# Patient Record
Sex: Female | Born: 2000 | Race: Black or African American | Hispanic: No | Marital: Single | Smoking: Never smoker
Health system: Southern US, Community
[De-identification: ages and names within clinical notes are randomized; demographics above are authoritative.]

---
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
## 2018-01-20 ENCOUNTER — Ambulatory Visit: Payer: BC Managed Care – PPO | Admitting: Family Medicine

## 2018-01-30 ENCOUNTER — Ambulatory Visit (INDEPENDENT_AMBULATORY_CARE_PROVIDER_SITE_OTHER): Payer: BC Managed Care – PPO | Admitting: Nurse Practitioner

## 2018-01-30 ENCOUNTER — Encounter: Payer: Self-pay | Admitting: Obstetrics

## 2018-01-30 ENCOUNTER — Other Ambulatory Visit: Payer: Self-pay

## 2018-01-30 ENCOUNTER — Encounter: Payer: Self-pay | Admitting: Nurse Practitioner

## 2018-01-30 VITALS — BP 120/79 | HR 77 | Temp 97.7°F | Ht 65.0 in | Wt 221.9 lb

## 2018-01-30 DIAGNOSIS — Z0000 Encounter for general adult medical examination without abnormal findings: Secondary | ICD-10-CM | POA: Diagnosis not present

## 2018-01-30 DIAGNOSIS — Z6836 Body mass index (BMI) 36.0-36.9, adult: Secondary | ICD-10-CM

## 2018-01-30 DIAGNOSIS — Z30011 Encounter for initial prescription of contraceptive pills: Secondary | ICD-10-CM | POA: Diagnosis not present

## 2018-01-30 DIAGNOSIS — E049 Nontoxic goiter, unspecified: Secondary | ICD-10-CM

## 2018-01-30 MED ORDER — LEVONORGESTREL-ETHINYL ESTRAD 0.15-30 MG-MCG PO TABS
1.0000 | ORAL_TABLET | Freq: Every day | ORAL | 2 refills | Status: DC
Start: 2018-01-30 — End: 2018-04-24

## 2018-01-30 NOTE — Progress Notes (Signed)
GYNECOLOGY ANNUAL PREVENTATIVE CARE ENCOUNTER NOTE  Subjective:   Emma Mendoza is a 17 y.o. G0P0000 female here for a routine annual gynecologic exam.  Current complaints: wants to start birth control.   Denies abnormal vaginal bleeding, discharge, pelvic pain, problems with intercourse or other gynecologic concerns.   Has had intercourse once last year and used a condom.  Currently has 3 day menstrual cycles and takes Motrin for cramping.  Discussed reproductive life plan and currently does not want to become pregnant.  Was thinking of a Nexplanon for birth control but has a distinct preference for wanting to have a period monthly and is happy with her current periods.  Is in college and has noticed that it is easier to gain weight in college.  Has been seen at [facility] and was told that her thyroid is enlarged and she needs to have blood work.  Denies night sweats or episodes of chilling.  Denies any problems with headaches.  No personal or family history of blood clots in lungs or legs.  Gynecologic History Patient's last menstrual period was 01/21/2018 (exact date). Contraception: condoms  Obstetric History OB History  Gravida Para Term Preterm AB Living  0 0 0 0 0 0  SAB TAB Ectopic Multiple Live Births  0 0 0 0 0    History reviewed. No pertinent past medical history.  History reviewed. No pertinent surgical history.  No current outpatient medications on file prior to visit.   No current facility-administered medications on file prior to visit.     No Known Allergies  Social History   Socioeconomic History  . Marital status: Single    Spouse name: Not on file  . Number of children: Not on file  . Years of education: Not on file  . Highest education level: Not on file  Occupational History    Employer: [employer]  Social Needs  . Financial resource strain: Not on file  . Food insecurity:    Worry: Not on file    Inability: Not on file  . Transportation  needs:    Medical: Not on file    Non-medical: Not on file  Tobacco Use  . Smoking status: Never Smoker  . Smokeless tobacco: Never Used  Substance and Sexual Activity  . Alcohol use: No  . Drug use: No  . Sexual activity: Not Currently  Lifestyle  . Physical activity:    Days per week: Not on file    Minutes per session: Not on file  . Stress: Not on file  Relationships  . Social connections:    Talks on phone: Not on file    Gets together: Not on file    Attends religious service: Not on file    Active member of club or organization: Not on file    Attends meetings of clubs or organizations: Not on file    Relationship status: Not on file  . Intimate partner violence:    Fear of current or ex partner: Not on file    Emotionally abused: Not on file    Physically abused: Not on file    Forced sexual activity: Not on file  Other Topics Concern  . Not on file  Social History Narrative   She is planning on going to [employer] in the Fall. She is majoring in Building surveyor. Plans to live in the dorm. Her grades were great in high school, A's and B's. Has a good friend group. Currently works  at [employer].       Exercise: She does not do any structured exercise. Walks at work.      Diet: Eats fast food daily. Like wendys, chick fila, and churches chicken. Will eat some fruit like pineapple and strawberries. Drinks sweet teas and water. Does not take a daily multivitamin.     Family History  Problem Relation Age of Onset  . Cancer Paternal Aunt   . Diabetes Maternal Grandfather   . Diabetes Paternal Grandfather   . Cancer Paternal Grandfather   . Cancer Mother 42       breast cancer  . Diabetes Father     The following portions of the patient's history were reviewed and updated as appropriate: allergies, current medications, past family history, past medical history, past social history, past surgical history and problem list.  Review of Systems Pertinent items  noted in HPI and remainder of comprehensive ROS otherwise negative.   Objective:  BP 120/79   Pulse 77   Temp 97.7 F (36.5 C)   Ht 5\' 5"  (1.651 m)   Wt 221 lb 14.4 oz (100.7 kg)   LMP 01/21/2018 (Exact Date)   BMI 36.93 kg/m  CONSTITUTIONAL: Well-developed, well-nourished female in no acute distress.  HENT:  Normocephalic, atraumatic, External right and left ear normal.  EYES: Conjunctivae and EOM are normal. Pupils are equal, round.  No scleral icterus.  NECK: Normal range of motion, supple, no masses.  Enlarged thyroid bilaterally - soft no masses.  SKIN: Skin is warm and dry. No rash noted. Not diaphoretic. No erythema. No pallor. NEUROLOGIC: Alert and oriented to person, place, and time. Normal reflexes, muscle tone coordination. No cranial nerve deficit noted. PSYCHIATRIC: Normal mood and affect. Normal behavior. Normal judgment and thought content. CARDIOVASCULAR: Normal heart rate noted, regular rhythm RESPIRATORY: Clear to auscultation bilaterally. Effort and breath sounds normal, no problems with respiration noted. BREASTS: Deferred PELVIC: Deferred MUSCULOSKELETAL: Normal range of motion. No tenderness.  No cyanosis, clubbing, or edema.    Assessment and Plan:  1. Enlarged thyroid  - TSH - T4, free - T3  2. Encounter for annual physical examination excluding gynecological examination in a patient older than 17 years  - Cervicovaginal ancillary only - unable to urinate so will cancel this testing as she is not having any symptoms - RPR - HIV antibody (with reflex)  3.  BMI of 36 Discussed healthy eating and increasing her exercise.  4.  Birth control prescription done Advised to begin on Sunday once menses starts.  Advised condoms for intercourse always for STD protection and as another measure to ensure she does not become pregnant.  Pap not indicated until age 83 Routine preventative health maintenance measures emphasized. Please refer to After Visit Summary  for other counseling recommendations.    Nolene Bernheim, RN, MSN, NP-BC Nurse Practitioner, [facility]

## 2018-01-30 NOTE — Progress Notes (Signed)
New GYN presents for abnormal periods lasting three days with a light day in between. She wants her Thyroids checked.

## 2018-01-30 NOTE — Patient Instructions (Addendum)
Look at the website:  Bedsider.org for info on contraception  Eat healthy food and increase exercise to take weight to a BMI less than 25.  Drink at least 8 8-oz glasses of water every day.  Start your pills on Sunday when your period starts.  It is OK to take the pills if you happen to still be bleeding on that Sunday.   Oral Contraception Use Oral contraceptive pills (OCPs) are medicines taken to prevent pregnancy. OCPs work by preventing the ovaries from releasing eggs. The hormones in OCPs also cause the cervical mucus to thicken, preventing the sperm from entering the uterus. The hormones also cause the uterine lining to become thin, not allowing a fertilized egg to attach to the inside of the uterus. OCPs are highly effective when taken exactly as prescribed. However, OCPs do not prevent sexually transmitted diseases (STDs). Safe sex practices, such as using condoms along with an OCP, can help prevent STDs. Before taking OCPs, you may have a physical exam and Pap test. Your health care provider may also order blood tests if necessary. Your health care provider will make sure you are a good candidate for oral contraception. Discuss with your health care provider the possible side effects of the OCP you may be prescribed. When starting an OCP, it can take 2 to 3 months for the body to adjust to the changes in hormone levels in your body. How to take oral contraceptive pills Your health care provider may advise you on how to start taking the first cycle of OCPs. Otherwise, you can:  Start on day 1 of your menstrual period. You will not need any backup contraceptive protection with this start time.  Start on the first Sunday after your menstrual period or the day you get your prescription. In these cases, you will need to use backup contraceptive protection for the first week.  Start the pill at any time of your cycle. If you take the pill within 5 days of the start of your period, you are  protected against pregnancy right away. In this case, you will not need a backup form of birth control. If you start at any other time of your menstrual cycle, you will need to use another form of birth control for 7 days. If your OCP is the type called a minipill, it will protect you from pregnancy after taking it for 2 days (48 hours).  After you have started taking OCPs:  If you forget to take 1 pill, take it as soon as you remember. Take the next pill at the regular time.  If you miss 2 or more pills, call your health care provider because different pills have different instructions for missed doses. Use backup birth control until your next menstrual period starts.  If you use a 28-day pack that contains inactive pills and you miss 1 of the last 7 pills (pills with no hormones), it will not matter. Throw away the rest of the non-hormone pills and start a new pill pack.  No matter which day you start the OCP, you will always start a new pack on that same day of the week. Have an extra pack of OCPs and a backup contraceptive method available in case you miss some pills or lose your OCP pack. Follow these instructions at home:  Do not smoke.  Always use a condom to protect against STDs. OCPs do not protect against STDs.  Use a calendar to mark your menstrual period days.  Read  the information and directions that came with your OCP. Talk to your health care provider if you have questions. Contact a health care provider if:  You develop nausea and vomiting.  You have abnormal vaginal discharge or bleeding.  You develop a rash.  You miss your menstrual period.  You are losing your hair.  You need treatment for mood swings or depression.  You get dizzy when taking the OCP.  You develop acne from taking the OCP.  You become pregnant. Get help right away if:  You develop chest pain.  You develop shortness of breath.  You have an uncontrolled or severe headache.  You develop  numbness or slurred speech.  You develop visual problems.  You develop pain, redness, and swelling in the legs. This information is not intended to replace advice given to you by your health care provider. Make sure you discuss any questions you have with your health care provider. Document Released: 03/21/2011 Document Revised: 09/07/2015 Document Reviewed: 09/20/2012 Elsevier Interactive Patient Education  2017 [employer].  Safe Sex Practicing safe sex means taking steps before and during sex to reduce your risk of:  Getting an STD (sexually transmitted disease).  Giving your partner an STD.  Unwanted pregnancy.  How can I practice safe sex?  To practice safe sex:  Limit your sexual partners to only one partner who is having sex with only you.  Avoid using alcohol and recreational drugs before having sex. These substances can affect your judgment.  Before having sex with a new partner: ? Talk to your partner about past partners, past STDs, and drug use. ? You and your partner should be screened for STDs and discuss the results with each other.  Check your body regularly for sores, blisters, rashes, or unusual discharge. If you notice any of these problems, visit your health care provider.  If you have symptoms of an infection or you are being treated for an STD, avoid sexual contact.  While having sex, use a condom. Make sure to: ? Use a condom every time you have vaginal, oral, or anal sex. Both females and males should wear condoms during oral sex. ? Keep condoms in place from the beginning to the end of sexual activity. ? Use a latex condom, if possible. Latex condoms offer the best protection. ? Use only water-based lubricants or oils to lubricate a condom. Using petroleum-based lubricants or oils will weaken the condom and increase the chance that it will break.  See your health care provider for regular screenings, exams, and tests for STDs.  Talk with your health  care provider about the form of birth control (contraception) that is best for you.  Get vaccinated against hepatitis B and human papillomavirus (HPV).  If you are at risk of being infected with HIV (human immunodeficiency virus), talk with your health care provider about taking a prescription medicine to prevent HIV infection. You are considered at risk for HIV if: ? You are a man who has sex with other men. ? You are a heterosexual man or woman who is sexually active with more than one partner. ? You take drugs by injection. ? You are sexually active with a partner who has HIV.  This information is not intended to replace advice given to you by your health care provider. Make sure you discuss any questions you have with your health care provider. Document Released: 05/09/2004 Document Revised: 08/16/2015 Document Reviewed: 02/19/2015 Elsevier Interactive Patient Education  [employer].

## 2018-01-31 LAB — T4, FREE: Free T4: 1.03 ng/dL (ref 0.93–1.60)

## 2018-01-31 LAB — T3: T3, Total: 122 ng/dL (ref 71–180)

## 2018-01-31 LAB — HIV ANTIBODY (ROUTINE TESTING W REFLEX): HIV Screen 4th Generation wRfx: NONREACTIVE

## 2018-01-31 LAB — RPR: RPR: NONREACTIVE

## 2018-01-31 LAB — TSH: TSH: 0.631 u[IU]/mL (ref 0.450–4.500)

## 2018-04-24 ENCOUNTER — Other Ambulatory Visit: Payer: Self-pay

## 2018-04-24 DIAGNOSIS — Z3041 Encounter for surveillance of contraceptive pills: Secondary | ICD-10-CM

## 2018-04-24 MED ORDER — LEVONORGESTREL-ETHINYL ESTRAD 0.15-30 MG-MCG PO TABS: 1.0000 | ORAL_TABLET | Freq: Every day | ORAL | 2 refills | Status: DC

## 2018-07-09 ENCOUNTER — Other Ambulatory Visit: Payer: Self-pay

## 2018-07-09 DIAGNOSIS — Z3041 Encounter for surveillance of contraceptive pills: Secondary | ICD-10-CM

## 2018-07-09 MED ORDER — LEVONORGESTREL-ETHINYL ESTRAD 0.15-30 MG-MCG PO TABS: 1.0000 | ORAL_TABLET | Freq: Every day | ORAL | 3 refills | Status: DC

## 2018-07-09 NOTE — Progress Notes (Signed)
Refill request for OCP done x 3

## 2018-10-05 ENCOUNTER — Other Ambulatory Visit: Payer: Self-pay | Admitting: Nurse Practitioner

## 2018-10-05 DIAGNOSIS — Z3041 Encounter for surveillance of contraceptive pills: Secondary | ICD-10-CM

## 2018-12-02 ENCOUNTER — Other Ambulatory Visit: Payer: Self-pay

## 2018-12-02 ENCOUNTER — Encounter (HOSPITAL_COMMUNITY): Payer: Self-pay

## 2018-12-02 ENCOUNTER — Ambulatory Visit (HOSPITAL_COMMUNITY)
Admission: EM | Admit: 2018-12-02 | Discharge: 2018-12-02 | Disposition: A | Discharge status: Home / Self Care | Payer: BC Managed Care – PPO | Attending: Family Medicine | Admitting: Family Medicine

## 2018-12-02 DIAGNOSIS — Z3041 Encounter for surveillance of contraceptive pills: Secondary | ICD-10-CM

## 2018-12-02 DIAGNOSIS — N9089 Other specified noninflammatory disorders of vulva and perineum: Secondary | ICD-10-CM | POA: Insufficient documentation

## 2018-12-02 MED ORDER — LEVONORGESTREL-ETHINYL ESTRAD 0.15-30 MG-MCG PO TABS: 1.0000 | ORAL_TABLET | Freq: Every day | ORAL | 3 refills | Status: DC

## 2018-12-02 NOTE — Progress Notes (Signed)
Birth control refill x3 months to get patient through until annual exam.

## 2018-12-02 NOTE — ED Provider Notes (Signed)
[facility]    CSN: [id number] Arrival date & time: 12/02/18  1704      History   Chief Complaint Chief Complaint  Patient presents with  . Vaginal Irritation    HPI Emma Mendoza is a 18 y.o. female.   Emma Mendoza presents with complaints of irritation to vulva. There is a specific area of skin breakdown which is painful. Started 8/14, she used organic pads for the first time. Has been painful since. Painful with wiping for example or if urine hits the area. She went to another urgent care two days ago and was told it is is skin irritation and to use petroleum jelly but this has not helped at all. She is not able to get in with her PCP for two weeks. She has has some similar in the past related to shaving but typically do not last this long. No other vaginal discharge. No urinary symptoms. Sexually active with 1 partner and uses condoms. Without contributing medical history.      ROS per HPI, negative if not otherwise mentioned.      History reviewed. No pertinent past medical history.  Patient Active Problem List   Diagnosis Date Noted  . BMI 36.0-36.9,adult 01/30/2018  . Encounter for BCP initial prescription 01/30/2018  . Enlarged thyroid 01/30/2018    History reviewed. No pertinent surgical history.  OB History    Gravida  0   Para  0   Term  0   Preterm  0   AB  0   Living  0     SAB  0   TAB  0   Ectopic  0   Multiple  0   Live Births  0            Home Medications    Prior to Admission medications   Medication Sig Start Date End Date Taking? Authorizing Provider  levonorgestrel-ethinyl estradiol (NORDETTE) 0.15-30 MG-MCG tablet Take 1 tablet by mouth daily. 12/02/18   Shelly Bombard, MD    Family History Family History  Problem Relation Age of Onset  . Cancer Paternal Aunt   . Diabetes Maternal Grandfather   . Diabetes Paternal Grandfather   . Cancer Paternal Grandfather   . Cancer Mother 72       breast cancer  .  Diabetes Father     Social History Social History   Tobacco Use  . Smoking status: Never Smoker  . Smokeless tobacco: Never Used  Substance Use Topics  . Alcohol use: No  . Drug use: No     Allergies   Patient has no known allergies.   Review of Systems Review of Systems   Physical Exam Triage Vital Signs ED Triage Vitals  Enc Vitals Group     BP 12/02/18 1717 131/82     Pulse Rate 12/02/18 1717 83     Resp 12/02/18 1717 20     Temp 12/02/18 1717 98.6 F (37 C)     Temp Source 12/02/18 1717 Oral     SpO2 12/02/18 1717 100 %     Weight --      Height --      Head Circumference --      Peak Flow --      Pain Score 12/02/18 1719 4     Pain Loc --      Pain Edu? --      Excl. in [place]? --    No data found.  Updated Vital  Signs BP 131/82 (BP Location: Right Arm)   Pulse 83   Temp 98.6 F (37 C) (Oral)   Resp 20   LMP 11/27/2018   SpO2 100%   Visual Acuity Right Eye Distance:   Left Eye Distance:   Bilateral Distance:    Right Eye Near:   Left Eye Near:    Bilateral Near:     Physical Exam Constitutional:      General: She is not in acute distress.    Appearance: She is well-developed.  Cardiovascular:     Rate and Rhythm: Normal rate.  Pulmonary:     Effort: Pulmonary effort is normal.  Genitourinary:      Comments: Open skin posterior to labia and vagina; tender; no obvious ulceration or vesicle; no drainage; no pustule or papule; appears to be in area of friction and patient does shave pubic hair ; some external thin white vaginal discharge noted; no other rash Skin:    General: Skin is warm and dry.  Neurological:     Mental Status: She is alert and oriented to person, place, and time.      UC Treatments / Results  Labs (all labs ordered are listed, but only abnormal results are displayed) Labs Reviewed  HSV CULTURE AND TYPING  CERVICOVAGINAL ANCILLARY ONLY    EKG   Radiology No results found.  Procedures Procedures  (including critical care time)  Medications Ordered in UC Medications - No data to display  Initial Impression / Assessment and Plan / UC Course  I have reviewed the triage vital signs and the nursing notes.  Pertinent labs & imaging results that were available during my care of the patient were reviewed by me and considered in my medical decision making (see chart for details).     Affected area does appear related to friction, likely originating while she was on her period using pads, and now with skin friction perpetuating the irritation. Provided herpes culture for reassurance, vaginal cytology also collected and pending. Will notify of any positive findings and if any changes to treatment are needed. Supportive cares recommended. If symptoms worsen or do not improve in the next week to return to be seen or to follow up with PCP.  Patient verbalized understanding and agreeable to plan.   Final Clinical Impressions(s) / UC Diagnoses   Final diagnoses:  Vulvar irritation     Discharge Instructions     This does appear likely related to friction.  You may continue to use an emollient such as vaseline to help to protect it and to decrease the friction to the area until it is able to heal.  Wear lose fitting clothing and change out of anything wet, promptly.  Will notify you of any positive findings from your samples and if any changes to treatment are needed. You may monitor your results on your MyChart online as well.     ED Prescriptions    None     Controlled Substance Prescriptions [place] Controlled Substance Registry consulted? Not Applicable   Georgetta HaberBurky, Shawnee Gambone B, NP 12/02/18 2128

## 2018-12-02 NOTE — ED Triage Notes (Signed)
Pt presents with a vaginal skin irritation X 7 days with no relief.

## 2018-12-02 NOTE — Discharge Instructions (Signed)
This does appear likely related to friction.  You may continue to use an emollient such as vaseline to help to protect it and to decrease the friction to the area until it is able to heal.  Wear lose fitting clothing and change out of anything wet, promptly.  Will notify you of any positive findings from your samples and if any changes to treatment are needed. You may monitor your results on your MyChart online as well.

## 2018-12-04 LAB — HSV CULTURE AND TYPING

## 2018-12-04 LAB — CERVICOVAGINAL ANCILLARY ONLY
Bacterial vaginitis: POSITIVE — AB
Candida vaginitis: NEGATIVE
Chlamydia: POSITIVE — AB
Neisseria Gonorrhea: NEGATIVE
Trichomonas: NEGATIVE

## 2018-12-05 ENCOUNTER — Encounter (HOSPITAL_COMMUNITY): Payer: Self-pay

## 2018-12-05 ENCOUNTER — Ambulatory Visit (HOSPITAL_COMMUNITY)
Admission: EM | Admit: 2018-12-05 | Discharge: 2018-12-05 | Disposition: A | Discharge status: Home / Self Care | Payer: BC Managed Care – PPO | Attending: Family Medicine | Admitting: Family Medicine

## 2018-12-05 ENCOUNTER — Other Ambulatory Visit: Payer: Self-pay

## 2018-12-05 DIAGNOSIS — A749 Chlamydial infection, unspecified: Secondary | ICD-10-CM

## 2018-12-05 DIAGNOSIS — B9689 Other specified bacterial agents as the cause of diseases classified elsewhere: Secondary | ICD-10-CM

## 2018-12-05 DIAGNOSIS — R768 Other specified abnormal immunological findings in serum: Secondary | ICD-10-CM

## 2018-12-05 DIAGNOSIS — N760 Acute vaginitis: Secondary | ICD-10-CM

## 2018-12-05 MED ORDER — TRIAMCINOLONE ACETONIDE 0.025 % EX OINT: 1.0000 "application " | TOPICAL_OINTMENT | Freq: Two times a day (BID) | CUTANEOUS | 0 refills | Status: DC | PRN

## 2018-12-05 MED ORDER — AZITHROMYCIN 250 MG PO TABS
ORAL_TABLET | ORAL | Status: AC
  Filled 2018-12-05: qty 4

## 2018-12-05 MED ORDER — VALACYCLOVIR HCL 1 G PO TABS: 1000.0000 mg | ORAL_TABLET | Freq: Two times a day (BID) | ORAL | 1 refills | Status: AC

## 2018-12-05 MED ORDER — METRONIDAZOLE 500 MG PO TABS: 500.0000 mg | ORAL_TABLET | Freq: Two times a day (BID) | ORAL | 0 refills | Status: DC

## 2018-12-05 MED ORDER — AZITHROMYCIN 250 MG PO TABS
1000.0000 mg | ORAL_TABLET | Freq: Once | ORAL | Status: AC
  Administered 2018-12-05: 1000 mg via ORAL

## 2018-12-05 NOTE — Discharge Instructions (Addendum)
Follow-up with your gynecologist if symptoms do not improve with prescribed treatment. You can try OTC probiotics to reduce reoccurrence of bacterial vaginosis.

## 2018-12-05 NOTE — ED Triage Notes (Signed)
Pt came in to discuss lab results and treatment plan.

## 2018-12-05 NOTE — ED Provider Notes (Addendum)
[facility]    CSN: [id number] Arrival date & time: 12/05/18  1302      History   Chief Complaint Chief Complaint  Patient presents with  . Discuss lab result and treatment    HPI Emma Mendoza is a 18 y.o. female.   HPI  Patient recently seen here at [facility] urgent [facility] with a complaint of vaginal ulceration.  Provider did perform STD testing which resulted positive for chlamydia and HSV 2 per culture.  Patient presents today for treatment and to discuss recent findings of labs.   History reviewed. No pertinent past medical history.  Patient Active Problem List   Diagnosis Date Noted  . BMI 36.0-36.9,adult 01/30/2018  . Encounter for BCP initial prescription 01/30/2018  . Enlarged thyroid 01/30/2018    History reviewed. No pertinent surgical history.  OB History    Gravida  0   Para  0   Term  0   Preterm  0   AB  0   Living  0     SAB  0   TAB  0   Ectopic  0   Multiple  0   Live Births  0            Home Medications    Prior to Admission medications   Medication Sig Start Date End Date Taking? Authorizing Provider  levonorgestrel-ethinyl estradiol (NORDETTE) 0.15-30 MG-MCG tablet Take 1 tablet by mouth daily. 12/02/18   Brock BadHarper, Charles A, MD    Family History Family History  Problem Relation Age of Onset  . Cancer Paternal Aunt   . Diabetes Maternal Grandfather   . Diabetes Paternal Grandfather   . Cancer Paternal Grandfather   . Cancer Mother [id number]       breast cancer  . Diabetes Father     Social History Social History   Tobacco Use  . Smoking status: Never Smoker  . Smokeless tobacco: Never Used  Substance Use Topics  . Alcohol use: No  . Drug use: No     Allergies   Patient has no known allergies.   Review of Systems Review of Systems Pertinent negatives listed in HPI Physical Exam Triage Vital Signs ED Triage Vitals  Enc Vitals Group     BP 12/05/18 1311 125/83     Pulse Rate 12/05/18 1311 90    Resp 12/05/18 1311 17     Temp 12/05/18 1311 99.2 F (37.3 C)     Temp Source 12/05/18 1311 Oral     SpO2 12/05/18 1311 100 %     Weight --      Height --      Head Circumference --      Peak Flow --      Pain Score 12/05/18 1319 3     Pain Loc --      Pain Edu? --      Excl. in [place]? --    No data found.  Updated Vital Signs BP 125/83 (BP Location: Right Arm)   Pulse 90   Temp 99.2 F (37.3 C) (Oral)   Resp 17   LMP 11/27/2018   SpO2 100%   Visual Acuity Right Eye Distance:   Left Eye Distance:   Bilateral Distance:    Right Eye Near:   Left Eye Near:    Bilateral Near:     Physical Exam General appearance: alert, well developed, well nourished, cooperative and in no distress Head: Normocephalic, without obvious abnormality, atraumatic Respiratory: Respirations even  and unlabored, normal respiratory rate Heart: rate and rhythm normal. No gallop or murmurs noted on exam  Abdomen: BS +, no distention, no rebound tenderness, or no mass Extremities: No gross deformities Skin: Skin color, texture, turgor normal. No rashes seen  Psych: Appropriate mood and affect. Neurologic: Mental status: Alert, oriented to person, place, and time, thought content appropriate.  UC Treatments / Results  Labs (all labs ordered are listed, but only abnormal results are displayed) Labs Reviewed - No data to display  EKG   Radiology No results found.  Procedures Procedures (including critical care time)  Medications Ordered in UC Medications - No data to display  Initial Impression / Assessment and Plan / UC Course  I have reviewed the triage vital signs and the nursing notes.  Pertinent labs & imaging results that were available during my care of the patient were reviewed by me and considered in my medical decision making (see chart for details).    Recent STD test results discussed at length. Treated for chlamydia empirically here in office today.  HSV 2 treatment  prescribed with valacyclovir 1000 mg twice daily x10 days.  Bacterial vaginosis treatment also prescribed for metronidazole 500 mg twice daily x7 days.  Questions guided and answered.  Patient was given instructions on safe sex practices and routine STD testing.  Patient verbalized understanding and agreement with plan. Final Clinical Impressions(s) / UC Diagnoses   Final diagnoses:  Chlamydia  HSV-2 seropositive  Bacterial vaginosis     Discharge Instructions     Follow-up with your gynecologist if symptoms do not improve with prescribed treatment. You can try OTC probiotics to reduce reoccurrence of bacterial vaginosis.    ED Prescriptions    Medication Sig Dispense Auth. Provider   triamcinolone (KENALOG) 0.025 % ointment Apply 1 application topically 2 (two) times daily as needed. 60 g Scot Jun, FNP   metroNIDAZOLE (FLAGYL) 500 MG tablet Take 1 tablet (500 mg total) by mouth 2 (two) times daily with a meal. DO NOT CONSUME ALCOHOL WHILE TAKING THIS MEDICATION. 14 tablet Scot Jun, FNP   valACYclovir (VALTREX) 1000 MG tablet Take 1 tablet (1,000 mg total) by mouth 2 (two) times daily for 10 days. 20 tablet Scot Jun, FNP     Controlled Substance Prescriptions [place] Controlled Substance Registry consulted? Not Applicable   Scot Jun, FNP 12/05/18 1518    Scot Jun, FNP 12/05/18 [phone]

## 2018-12-15 ENCOUNTER — Other Ambulatory Visit: Payer: Self-pay

## 2018-12-15 ENCOUNTER — Ambulatory Visit (INDEPENDENT_AMBULATORY_CARE_PROVIDER_SITE_OTHER): Payer: BC Managed Care – PPO | Admitting: Advanced Practice Midwife

## 2018-12-15 VITALS — BP 108/78 | HR 85 | Wt 222.0 lb

## 2018-12-15 DIAGNOSIS — A749 Chlamydial infection, unspecified: Secondary | ICD-10-CM | POA: Diagnosis not present

## 2018-12-15 DIAGNOSIS — Z3009 Encounter for other general counseling and advice on contraception: Secondary | ICD-10-CM

## 2018-12-15 DIAGNOSIS — A6004 Herpesviral vulvovaginitis: Secondary | ICD-10-CM | POA: Insufficient documentation

## 2018-12-15 MED ORDER — VALACYCLOVIR HCL 1 G PO TABS: 1000.0000 mg | ORAL_TABLET | Freq: Every day | ORAL | 10 refills | Status: DC

## 2018-12-15 NOTE — Progress Notes (Signed)
  GYNECOLOGY PROGRESS NOTE  History:  18 y.o. G0P0000 presents to [facility] office today for problem gyn visit. She reports recent diagnosis of genital HSV, chlamydia and BV and completed treatment yesterday.  She reports vaginal irritation started after using pads for her menses, then developed into more painful lesions. She was diagnosed by vaginal swab by [facility].  She was also positive for chlamydia and treated with azithromycin in the office.  She completed 10 day course of Valtrex and 7 day course of Flagyl.  Her partner was tested, she is not sure he was treated, but she is no longer sexually active with him.  She reports the lesions have resolved and she has not pain or irritation.  She denies any abnormal discharge.  She denies h/a, dizziness, shortness of breath, n/v, or fever/chills.    The following portions of the patient's history were reviewed and updated as appropriate: allergies, current medications, past family history, past medical history, past social history, past surgical history and problem list. Last pap smear: n/a due to young age.  Review of Systems:  Pertinent items are noted in HPI.   Objective:  Physical Exam Blood pressure 108/78, pulse 85, weight 222 lb (100.7 kg), last menstrual period 11/27/2018. VS reviewed, nursing note reviewed,  Constitutional: well developed, well nourished, no distress HEENT: normocephalic CV: normal rate Pulm/chest wall: normal effort Breast Exam: deferred Abdomen: soft Neuro: alert and oriented x 3 Skin: warm, dry Psych: affect normal Pelvic exam: Deferred  Assessment & Plan:  1. Herpes simplex vulvovaginitis --Dx and treated with 10 day course of Valtrex --Symptoms improved per pt, offered exam of lesions to evaluate healing, pt declined --Rx for PRN use for recurrent outbreaks - valACYclovir (VALTREX) 1000 MG tablet; Take 1 tablet (1,000 mg total) by mouth daily.  Dispense: 5 tablet; Refill: 10  2. Chlamydia  infection --Treated on 12/05/18, no longer sexually active with partner  3. Encounter for counseling regarding contraception --Currently on OCPs.  Has missed pills this summer due to irregular scheduled. --Discussed LARCs as most effective forms of birth control.  Discussed benefits/risks of other methods.  Pt desires to continue OCPs for now but considering LARC. Market researcher given.   Fatima Blank, CNM 11:28 AM

## 2018-12-15 NOTE — Progress Notes (Signed)
RGYN patient presents for a problem visit today.  CC:  Pt states she had a vaginal irritation that was very painful/ pt had to go to 2 different urgent cares for an evaluation. [facility] pt was confirmed to have HSV-2 and CT.   Pt states she is feeling much better.patient still wants exam for assurance   Just finished Flagyl Saturday   Pt was treated for CT on 12/05/18 at the office where she was tested.

## 2019-01-31 ENCOUNTER — Other Ambulatory Visit: Payer: Self-pay

## 2019-01-31 ENCOUNTER — Encounter (HOSPITAL_COMMUNITY): Payer: Self-pay

## 2019-01-31 ENCOUNTER — Ambulatory Visit (HOSPITAL_COMMUNITY)
Admission: EM | Admit: 2019-01-31 | Discharge: 2019-01-31 | Disposition: A | Discharge status: Home / Self Care | Payer: BC Managed Care – PPO | Attending: Emergency Medicine | Admitting: Emergency Medicine

## 2019-01-31 DIAGNOSIS — R21 Rash and other nonspecific skin eruption: Secondary | ICD-10-CM | POA: Diagnosis not present

## 2019-01-31 MED ORDER — CLOTRIMAZOLE-BETAMETHASONE 1-0.05 % EX CREA
TOPICAL_CREAM | CUTANEOUS | 0 refills | Status: AC
Start: 2019-01-31 — End: ?

## 2019-01-31 NOTE — ED Provider Notes (Signed)
[facility]    CSN: [id number] Arrival date & time: 01/31/19  1338      History   Chief Complaint Chief Complaint  Patient presents with  . Appointment    140  . ringworm    HPI Emma Mendoza is a 18 y.o. female negativity past medical history presenting today for evaluation of a lesion to her face.  Patient states that over the past 3 days she has had an area that has been itchy to her left cheek.  Concerned about ringworm as she has had this similarly on her hands and neck in the past.  She denies other lesions elsewhere at this time.  Denies any fevers or chills.  Denies associated pain.  Denies any drainage.  HPI  History reviewed. No pertinent past medical history.  Patient Active Problem List   Diagnosis Date Noted  . Herpes simplex vulvovaginitis 12/15/2018  . Chlamydia infection 12/15/2018  . BMI 36.0-36.9,adult 01/30/2018  . Encounter for BCP initial prescription 01/30/2018  . Enlarged thyroid 01/30/2018    History reviewed. No pertinent surgical history.  OB History    Gravida  0   Para  0   Term  0   Preterm  0   AB  0   Living  0     SAB  0   TAB  0   Ectopic  0   Multiple  0   Live Births  0            Home Medications    Prior to Admission medications   Medication Sig Start Date End Date Taking? Authorizing Provider  clotrimazole-betamethasone (LOTRISONE) cream Apply to affected area 2 times daily 01/31/19   Wieters, [place] C, PA-C  levonorgestrel-ethinyl estradiol (NORDETTE) 0.15-30 MG-MCG tablet Take 1 tablet by mouth daily. 12/02/18   Brock Bad, MD  valACYclovir (VALTREX) 1000 MG tablet Take 1 tablet (1,000 mg total) by mouth daily. 12/15/18   Leftwich-Kirby, Wilmer Floor, CNM    Family History Family History  Problem Relation Age of Onset  . Cancer Paternal Aunt   . Diabetes Maternal Grandfather   . Diabetes Paternal Grandfather   . Cancer Paternal Grandfather   . Cancer Mother 56       breast cancer  .  Diabetes Father     Social History Social History   Tobacco Use  . Smoking status: Never Smoker  . Smokeless tobacco: Never Used  Substance Use Topics  . Alcohol use: No  . Drug use: No     Allergies   Patient has no known allergies.   Review of Systems Review of Systems  Constitutional: Negative for fatigue and fever.  Eyes: Negative for visual disturbance.  Respiratory: Negative for shortness of breath.   Cardiovascular: Negative for chest pain.  Gastrointestinal: Negative for abdominal pain, nausea and vomiting.  Musculoskeletal: Negative for arthralgias and joint swelling.  Skin: Positive for color change and rash. Negative for wound.  Neurological: Negative for dizziness, weakness, light-headedness and headaches.     Physical Exam Triage Vital Signs ED Triage Vitals  Enc Vitals Group     BP 01/31/19 1354 102/72     Pulse Rate 01/31/19 1354 83     Resp 01/31/19 1354 18     Temp 01/31/19 1354 98.7 F (37.1 C)     Temp Source 01/31/19 1354 Oral     SpO2 01/31/19 1354 97 %     Weight 01/31/19 1352 168 lb (76.2 kg)  Height --      Head Circumference --      Peak Flow --      Pain Score 01/31/19 1352 0     Pain Loc --      Pain Edu? --      Excl. in [place]? --    No data found.  Updated Vital Signs BP 102/72 (BP Location: Right Arm)   Pulse 83   Temp 98.7 F (37.1 C) (Oral)   Resp 18   Wt 168 lb (76.2 kg)   LMP 01/01/2019   SpO2 97%   BMI 27.96 kg/m   Visual Acuity Right Eye Distance:   Left Eye Distance:   Bilateral Distance:    Right Eye Near:   Left Eye Near:    Bilateral Near:     Physical Exam Vitals signs and nursing note reviewed.  Constitutional:      Appearance: She is well-developed.     Comments: No acute distress  HENT:     Head: Normocephalic and atraumatic.     Nose: Nose normal.  Eyes:     Conjunctiva/sclera: Conjunctivae normal.  Neck:     Musculoskeletal: Neck supple.  Cardiovascular:     Rate and Rhythm: Normal  rate.  Pulmonary:     Effort: Pulmonary effort is normal. No respiratory distress.  Abdominal:     General: There is no distension.  Musculoskeletal: Normal range of motion.  Skin:    General: Skin is warm and dry.     Comments: Isolated circular erythematous slightly raised lesion to left perioral area, small papular bumps within lesion, no surrounding induration or fluctuance  Neurological:     Mental Status: She is alert and oriented to person, place, and time.        UC Treatments / Results  Labs (all labs ordered are listed, but only abnormal results are displayed) Labs Reviewed - No data to display  EKG   Radiology No results found.  Procedures Procedures (including critical care time)  Medications Ordered in UC Medications - No data to display  Initial Impression / Assessment and Plan / UC Course  I have reviewed the triage vital signs and the nursing notes.  Pertinent labs & imaging results that were available during my care of the patient were reviewed by me and considered in my medical decision making (see chart for details).     Isolated lesion on face, eczematous versus fungal.  Seems less likely infectious as does not have any associated pain.  Providing Lotrisone to help with itching as well as to cover any fungal etiology.  Continue to monitor,Discussed strict return precautions. Patient verbalized understanding and is agreeable with plan.  Final Clinical Impressions(s) / UC Diagnoses   Final diagnoses:  Rash and nonspecific skin eruption     Discharge Instructions     Please apply lotrisone twice daily for the next week  Follow up if spreading, worsening, developing pain, swelling, drainage   ED Prescriptions    Medication Sig Dispense Auth. Provider   clotrimazole-betamethasone (LOTRISONE) cream Apply to affected area 2 times daily 30 g Wieters, [place] C, PA-C     PDMP not reviewed this encounter.   Joneen Caraway [place] C, PA-C 01/31/19  1430

## 2019-01-31 NOTE — Discharge Instructions (Addendum)
Please apply lotrisone twice daily for the next week  Follow up if spreading, worsening, developing pain, swelling, drainage

## 2019-01-31 NOTE — ED Triage Notes (Signed)
Pt states she has a ringworm on her face. X 3 days

## 2019-02-23 ENCOUNTER — Other Ambulatory Visit: Payer: Self-pay | Admitting: Obstetrics

## 2019-02-23 DIAGNOSIS — Z3041 Encounter for surveillance of contraceptive pills: Secondary | ICD-10-CM

## 2019-03-09 ENCOUNTER — Encounter (HOSPITAL_COMMUNITY): Payer: Self-pay

## 2019-03-09 ENCOUNTER — Other Ambulatory Visit: Payer: Self-pay

## 2019-03-09 ENCOUNTER — Ambulatory Visit (HOSPITAL_COMMUNITY)
Admission: EM | Admit: 2019-03-09 | Discharge: 2019-03-09 | Disposition: A | Discharge status: Home / Self Care | Payer: BC Managed Care – PPO | Attending: Nurse Practitioner | Admitting: Nurse Practitioner

## 2019-03-09 DIAGNOSIS — N760 Acute vaginitis: Secondary | ICD-10-CM | POA: Insufficient documentation

## 2019-03-09 DIAGNOSIS — N3000 Acute cystitis without hematuria: Secondary | ICD-10-CM | POA: Diagnosis not present

## 2019-03-09 DIAGNOSIS — Z3202 Encounter for pregnancy test, result negative: Secondary | ICD-10-CM | POA: Diagnosis not present

## 2019-03-09 LAB — POC URINE PREG, ED: Preg Test, Ur: NEGATIVE

## 2019-03-09 LAB — POCT URINALYSIS DIP (DEVICE)
Bilirubin Urine: NEGATIVE
Glucose, UA: NEGATIVE mg/dL
Ketones, ur: 15 mg/dL — AB
Nitrite: NEGATIVE
Protein, ur: NEGATIVE mg/dL
Specific Gravity, Urine: >=1.03 (ref 1.005–1.030)
Urobilinogen, UA: 0.2 mg/dL (ref 0.0–1.0)
pH: 6 (ref 5.0–8.0)

## 2019-03-09 LAB — POCT PREGNANCY, URINE: Preg Test, Ur: NEGATIVE

## 2019-03-09 MED ORDER — NITROFURANTOIN MONOHYD MACRO 100 MG PO CAPS: 100.0000 mg | ORAL_CAPSULE | Freq: Two times a day (BID) | ORAL | 0 refills | Status: DC

## 2019-03-09 MED ORDER — FLUCONAZOLE 150 MG PO TABS: 150.0000 mg | ORAL_TABLET | ORAL | 0 refills | Status: AC

## 2019-03-09 MED ORDER — METRONIDAZOLE 500 MG PO TABS: 500.0000 mg | ORAL_TABLET | Freq: Two times a day (BID) | ORAL | 0 refills | Status: DC

## 2019-03-09 NOTE — ED Provider Notes (Signed)
[facility]    CSN: [id number] Arrival date & time: 03/09/19  1139      History   Chief Complaint Chief Complaint  Patient presents with  . Vaginitis    HPI Emma Mendoza is a 18 y.o. female.   Subjective:   Emma Mendoza is a 18 y.o. female who presents for evaluation of vaginal discharge green, curd-like and odorless. Symptoms have been present for 1 day. Symptoms include local irritation and vulvar itching. Menstrual pattern: bleeding irregularly due to OCP. LMP was in October. Contraception: OCP (estrogen/progesterone) STI Risk: Possible STD exposure. She is sexually active with one female partner. She does not routinely use condoms.   The following portions of the patient's history were reviewed and updated as appropriate: allergies, current medications, past family history, past medical history, past social history, past surgical history and problem list.        History reviewed. No pertinent past medical history.  Patient Active Problem List   Diagnosis Date Noted  . Herpes simplex vulvovaginitis 12/15/2018  . Chlamydia infection 12/15/2018  . BMI 36.0-36.9,adult 01/30/2018  . Encounter for BCP initial prescription 01/30/2018  . Enlarged thyroid 01/30/2018    History reviewed. No pertinent surgical history.  OB History    Gravida  0   Para  0   Term  0   Preterm  0   AB  0   Living  0     SAB  0   TAB  0   Ectopic  0   Multiple  0   Live Births  0            Home Medications    Prior to Admission medications   Medication Sig Start Date End Date Taking? Authorizing Provider  ALTAVERA 0.15-30 MG-MCG tablet TAKE 1 TABLET BY MOUTH EVERY DAY 02/23/19   Brock BadHarper, Charles A, MD  clotrimazole-betamethasone (LOTRISONE) cream Apply to affected area 2 times daily 01/31/19   Wieters, Hallie C, PA-C  valACYclovir (VALTREX) 1000 MG tablet Take 1 tablet (1,000 mg total) by mouth daily. 12/15/18   Leftwich-Kirby, Wilmer FloorLisa A, CNM    Family History  Family History  Problem Relation Age of Onset  . Cancer Paternal Aunt   . Diabetes Maternal Grandfather   . Diabetes Paternal Grandfather   . Cancer Paternal Grandfather   . Cancer Mother [id number]       breast cancer  . Diabetes Father     Social History Social History   Tobacco Use  . Smoking status: Never Smoker  . Smokeless tobacco: Never Used  Substance Use Topics  . Alcohol use: No  . Drug use: No     Allergies   Patient has no known allergies.   Review of Systems Review of Systems  Constitutional: Negative for fever.  Gastrointestinal: Negative for abdominal pain, nausea and vomiting.  Genitourinary: Positive for vaginal discharge. Negative for dysuria and flank pain.  Musculoskeletal: Negative for back pain.  All other systems reviewed and are negative.    Physical Exam Triage Vital Signs ED Triage Vitals  Enc Vitals Group     BP 03/09/19 1215 127/80     Pulse Rate 03/09/19 1215 78     Resp 03/09/19 1215 18     Temp 03/09/19 1215 98.4 F (36.9 C)     Temp Source 03/09/19 1215 Oral     SpO2 03/09/19 1215 100 %     Weight --      Height --  Head Circumference --      Peak Flow --      Pain Score 03/09/19 1216 0     Pain Loc --      Pain Edu? --      Excl. in [place]? --    No data found.  Updated Vital Signs BP 127/80 (BP Location: Right Arm)   Pulse 78   Temp 98.4 F (36.9 C) (Oral)   Resp 18   SpO2 100%   Visual Acuity Right Eye Distance:   Left Eye Distance:   Bilateral Distance:    Right Eye Near:   Left Eye Near:    Bilateral Near:     Physical Exam Vitals signs reviewed.  Constitutional:      Appearance: Normal appearance.  HENT:     Head: Normocephalic.  Neck:     Musculoskeletal: Normal range of motion and neck supple.  Cardiovascular:     Rate and Rhythm: Normal rate and regular rhythm.     Pulses: Normal pulses.     Heart sounds: Normal heart sounds.  Abdominal:     Palpations: Abdomen is soft.     Tenderness: There is  no right CVA tenderness or left CVA tenderness.  Musculoskeletal: Normal range of motion.  Skin:    General: Skin is warm and dry.  Neurological:     General: No focal deficit present.     Mental Status: She is alert and oriented to person, place, and time.      UC Treatments / Results  Labs (all labs ordered are listed, but only abnormal results are displayed) Labs Reviewed  POCT URINALYSIS DIP (DEVICE) - Abnormal; Notable for the following components:      Result Value   Ketones, ur 15 (*)    Hgb urine dipstick SMALL (*)    Leukocytes,Ua LARGE (*)    All other components within normal limits  POC URINE PREG, ED  POCT PREGNANCY, URINE  CERVICOVAGINAL ANCILLARY ONLY    EKG   Radiology No results found.  Procedures Procedures (including critical care time)  Medications Ordered in UC Medications - No data to display  Initial Impression / Assessment and Plan / UC Course  I have reviewed the triage vital signs and the nursing notes.  Pertinent labs & imaging results that were available during my care of the patient were reviewed by me and considered in my medical decision making (see chart for details).    18 yo female presenting with vaginal discharge, local irritation and vulvar itching. Patient afebrile. Nontoxic appearing. Urine pregnancy negative. UA indicative for an acute cystitis. Tests for GC/chamlydia, trich, yeast and BV pending. Symptomatic local care discussed. Transport planner distributed. Abstinence from intercourse discussed. Discussed safe sex.  Today's evaluation has revealed no signs of a dangerous process. Discussed diagnosis with patient and/or guardian. Patient and/or guardian aware of their diagnosis, possible red flag symptoms to watch out for and need for close follow up. Patient and/or guardian understands verbal and written discharge instructions. Patient and/or guardian comfortable with plan and disposition.  Patient and/or guardian has a  clear mental status at this time, good insight into illness (after discussion and teaching) and has clear judgment to make decisions regarding their care  This care was provided during an unprecedented National Emergency due to the Novel Coronavirus (COVID-19) pandemic. COVID-19 infections and transmission risks place heavy strains on healthcare resources.  As this pandemic evolves, our facility, providers, and staff strive to respond fluidly, to remain operational,  and to provide care relative to available resources and information. Outcomes are unpredictable and treatments are without well-defined guidelines. Further, the impact of COVID-19 on all aspects of urgent care, including the impact to patients seeking care for reasons other than COVID-19, is unavoidable during this national emergency. At this time of the global pandemic, management of patients has significantly changed, even for non-COVID positive patients given high local and regional COVID volumes at this time requiring high healthcare system and resource utilization. The standard of care for management of both COVID suspected and non-COVID suspected patients continues to change rapidly at the local, regional, national, and global levels. This patient was worked up and treated to the best available but ever changing evidence and resources available at this current time.   Documentation was completed with the aid of voice recognition software. Transcription may contain typographical errors.  Final Clinical Impressions(s) / UC Diagnoses   Final diagnoses:  Acute cystitis without hematuria     Discharge Instructions      Keep your genital area clean and dry. Avoid soap and feminine products with fragrances   Do not douche or use tampons until symptoms clear   Do not have sex until your symptoms clear   Practice safe sex and use condoms.  Wipe from front to back. This avoids the spread of bacteria from the rectum to the vagina.   Take medications as prescribed   Further treatment may be needed depending on your pending tests   You will only be notified for positive results. You may go online to MyChart in a couple of days to review all of your test results   Take Care, [facility]     ED Prescriptions    None     PDMP not reviewed this encounter.   Enrique Sack, [place] 03/09/19 1315

## 2019-03-09 NOTE — ED Triage Notes (Signed)
Pt presents with vaginal itching and green vaginal discharge X 2 days.

## 2019-03-09 NOTE — Discharge Instructions (Addendum)
Keep your genital area clean and dry. Avoid soap and feminine products with fragrances  Do not douche or use tampons until symptoms clear  Do not have sex until your symptoms clear  Practice safe sex and use condoms. Wipe from front to back. This avoids the spread of bacteria from the rectum to the vagina. Take medications as prescribed  Further treatment may be needed depending on your pending tests  You will only be notified for positive results. You may go online to MyChart in a couple of days to review all of your test results   Take Care, [place]

## 2019-03-12 LAB — CERVICOVAGINAL ANCILLARY ONLY
Bacterial vaginitis: POSITIVE — AB
Candida vaginitis: POSITIVE — AB
Chlamydia: NEGATIVE
Neisseria Gonorrhea: NEGATIVE
Trichomonas: NEGATIVE

## 2019-04-12 ENCOUNTER — Other Ambulatory Visit: Payer: Self-pay | Admitting: Family Medicine

## 2019-08-10 ENCOUNTER — Other Ambulatory Visit: Payer: Self-pay | Admitting: Advanced Practice Midwife

## 2019-08-10 ENCOUNTER — Other Ambulatory Visit: Payer: Self-pay

## 2019-08-10 ENCOUNTER — Encounter: Payer: Self-pay | Admitting: Advanced Practice Midwife

## 2019-08-10 ENCOUNTER — Ambulatory Visit (INDEPENDENT_AMBULATORY_CARE_PROVIDER_SITE_OTHER): Payer: BC Managed Care – PPO | Admitting: Advanced Practice Midwife

## 2019-08-10 VITALS — BP 114/73 | HR 89 | Ht 64.0 in | Wt 232.6 lb

## 2019-08-10 DIAGNOSIS — N611 Abscess of the breast and nipple: Secondary | ICD-10-CM

## 2019-08-10 MED ORDER — CEFADROXIL 500 MG PO CAPS: 500.0000 mg | ORAL_CAPSULE | Freq: Two times a day (BID) | ORAL | 0 refills | Status: AC

## 2019-08-10 NOTE — Progress Notes (Signed)
Pt is in the office for GYN visit. Pt reports left breast is swollen and painful, previously had hard lump but now it feels jelly-like.

## 2019-08-10 NOTE — Patient Instructions (Signed)
Skin Abscess  A skin abscess is an infected area on or under your skin that contains a collection of pus and other material. An abscess may also be called a furuncle, carbuncle, or boil. An abscess can occur in or on almost any part of your body. Some abscesses break open (rupture) on their own. Most continue to get worse unless they are treated. The infection can spread deeper into the body and eventually into your blood, which can make you feel ill. Treatment usually involves draining the abscess. What are the causes? An abscess occurs when germs, like bacteria, pass through your skin and cause an infection. This may be caused by:  A scrape or cut on your skin.  A puncture wound through your skin, including a needle injection or insect bite.  Blocked oil or sweat glands.  Blocked and infected hair follicles.  A cyst that forms beneath your skin (sebaceous cyst) and becomes infected. What increases the risk? This condition is more likely to develop in people who:  Have a weak body defense system (immune system).  Have diabetes.  Have dry and irritated skin.  Get frequent injections or use illegal IV drugs.  Have a foreign body in a wound, such as a splinter.  Have problems with their lymph system or veins. What are the signs or symptoms? Symptoms of this condition include:  A painful, firm bump under the skin.  A bump with pus at the top. This may break through the skin and drain. Other symptoms include:  Redness surrounding the abscess site.  Warmth.  Swelling of the lymph nodes (glands) near the abscess.  Tenderness.  A sore on the skin. How is this diagnosed? This condition may be diagnosed based on:  A physical exam.  Your medical history.  A sample of pus. This may be used to find out what is causing the infection.  Blood tests.  Imaging tests, such as an ultrasound, CT scan, or MRI. How is this treated? A small abscess that drains on its own may  not need treatment. Treatment for larger abscesses may include:  Moist heat or heat pack applied to the area several times a day.  A procedure to drain the abscess (incision and drainage).  Antibiotic medicines. For a severe abscess, you may first get antibiotics through an IV and then change to antibiotics by mouth. Follow these instructions at home: Medicines   Take over-the-counter and prescription medicines only as told by your health care provider.  If you were prescribed an antibiotic medicine, take it as told by your health care provider. Do not stop taking the antibiotic even if you start to feel better. Abscess care   If you have an abscess that has not drained, apply heat to the affected area. Use the heat source that your health care provider recommends, such as a moist heat pack or a heating pad. ? Place a towel between your skin and the heat source. ? Leave the heat on for 20-30 minutes. ? Remove the heat if your skin turns bright red. This is especially important if you are unable to feel pain, heat, or cold. You may have a greater risk of getting burned.  Follow instructions from your health care provider about how to take care of your abscess. Make sure you: ? Cover the abscess with a bandage (dressing). ? Change your dressing or gauze as told by your health care provider. ? Wash your hands with soap and water before you change the   dressing or gauze. If soap and water are not available, use hand sanitizer.  Check your abscess every day for signs of a worsening infection. Check for: ? More redness, swelling, or pain. ? More fluid or blood. ? Warmth. ? More pus or a bad smell. General instructions  To avoid spreading the infection: ? Do not share personal care items, towels, or hot tubs with others. ? Avoid making skin contact with other people.  Keep all follow-up visits as told by your health care provider. This is important. Contact a health care provider if  you have:  More redness, swelling, or pain around your abscess.  More fluid or blood coming from your abscess.  Warm skin around your abscess.  More pus or a bad smell coming from your abscess.  A fever.  Muscle aches.  Chills or a general ill feeling. Get help right away if you:  Have severe pain.  See red streaks on your skin spreading away from the abscess. Summary  A skin abscess is an infected area on or under your skin that contains a collection of pus and other material.  A small abscess that drains on its own may not need treatment.  Treatment for larger abscesses may include having a procedure to drain the abscess and taking an antibiotic. This information is not intended to replace advice given to you by your health care provider. Make sure you discuss any questions you have with your health care provider. Document Revised: 07/23/2018 Document Reviewed: 05/15/2017 Elsevier Patient Education  2020 [employer]  

## 2019-08-10 NOTE — Progress Notes (Signed)
  GYNECOLOGY PROGRESS NOTE  History:  19 y.o. G0P0000 presents to [facility] office today for problem gyn visit. She reports knot with pain under her left breast x 2-3 days.  She reports she has had small knots before, especially with underwire bras but this time it became larger and more painful. She is taking OCPs for contraception but frequently misses pills.  She denies h/a, dizziness, shortness of breath, n/v, or fever/chills.    The following portions of the patient's history were reviewed and updated as appropriate: allergies, current medications, past family history, past medical history, past social history, past surgical history and problem list.   Review of Systems:  Pertinent items are noted in HPI.   Objective:  Physical Exam Blood pressure 114/73, pulse 89, height 5\' 4"  (1.626 m), weight 232 lb 9.6 oz (105.5 kg), last menstrual period 07/29/2019. VS reviewed, nursing note reviewed,  Constitutional: well developed, well nourished, no distress HEENT: normocephalic CV: normal rate Pulm/chest wall: normal effort Breast Exam: On visual inspection, area under left breast of firmness approximately 3 x 3 cm with smaller fluctuant area with slight drainage in the center, approximately 1cm x 1 cm Abdomen: soft Neuro: alert and oriented x 3 Skin: warm, dry Psych: affect normal Pelvic exam: Deferred  Assessment & Plan:  1. Nonpuerperal abscess of left breast --Abscess on underside of left breast, painful and warm to touch with drainage. No s/sx of systemic infection.  --Start abx today with Duricef 500 mg BID x 10 days --07/31/2019 and [facility] --Ibuprofen, ice, warm compresses PRN - US BREAST ASPIRATION LEFT; Future   [place], CNM 4:11 PM

## 2019-08-20 ENCOUNTER — Ambulatory Visit
Admission: RE | Admit: 2019-08-20 | Discharge: 2019-08-20 | Disposition: A | Discharge status: Home / Self Care | Payer: BC Managed Care – PPO | Source: Ambulatory Visit | Attending: Advanced Practice Midwife | Admitting: Advanced Practice Midwife

## 2019-08-20 ENCOUNTER — Other Ambulatory Visit: Payer: Self-pay

## 2019-08-20 ENCOUNTER — Other Ambulatory Visit: Payer: Self-pay | Admitting: Advanced Practice Midwife

## 2019-08-20 DIAGNOSIS — N611 Abscess of the breast and nipple: Secondary | ICD-10-CM

## 2019-08-23 ENCOUNTER — Telehealth: Payer: Self-pay

## 2019-08-23 NOTE — Telephone Encounter (Signed)
[facility] called over the weekend reporting a critical finding for this patient. Please review patient's imaging result and advise.

## 2019-08-24 ENCOUNTER — Other Ambulatory Visit: Payer: Self-pay | Admitting: Advanced Practice Midwife

## 2019-08-24 DIAGNOSIS — N611 Abscess of the breast and nipple: Secondary | ICD-10-CM

## 2019-08-24 MED ORDER — CEFADROXIL 500 MG PO CAPS: 500.0000 mg | ORAL_CAPSULE | Freq: Two times a day (BID) | ORAL | 0 refills | Status: AC

## 2019-09-01 ENCOUNTER — Ambulatory Visit
Admission: RE | Admit: 2019-09-01 | Discharge: 2019-09-01 | Disposition: A | Discharge status: Home / Self Care | Payer: BC Managed Care – PPO | Source: Ambulatory Visit | Attending: Advanced Practice Midwife | Admitting: Advanced Practice Midwife

## 2019-09-01 ENCOUNTER — Other Ambulatory Visit: Payer: Self-pay

## 2019-09-01 ENCOUNTER — Other Ambulatory Visit: Payer: Self-pay | Admitting: Advanced Practice Midwife

## 2019-09-01 DIAGNOSIS — N611 Abscess of the breast and nipple: Secondary | ICD-10-CM

## 2019-09-02 ENCOUNTER — Ambulatory Visit: Payer: BC Managed Care – PPO | Admitting: Obstetrics & Gynecology

## 2019-10-05 ENCOUNTER — Other Ambulatory Visit: Payer: BC Managed Care – PPO

## 2019-10-12 ENCOUNTER — Other Ambulatory Visit: Payer: Self-pay | Admitting: Obstetrics

## 2019-10-12 DIAGNOSIS — Z3041 Encounter for surveillance of contraceptive pills: Secondary | ICD-10-CM

## 2020-08-29 ENCOUNTER — Telehealth: Payer: Self-pay | Admitting: Obstetrics

## 2020-08-29 DIAGNOSIS — A6004 Herpesviral vulvovaginitis: Secondary | ICD-10-CM

## 2020-08-29 DIAGNOSIS — Z3041 Encounter for surveillance of contraceptive pills: Secondary | ICD-10-CM

## 2020-08-29 MED ORDER — VALACYCLOVIR HCL 1 G PO TABS: 1000.0000 mg | ORAL_TABLET | Freq: Every day | ORAL | 1 refills | Status: DC

## 2020-08-29 MED ORDER — LEVONORGESTREL-ETHINYL ESTRAD 0.15-30 MG-MCG PO TABS: 1.0000 | ORAL_TABLET | Freq: Every day | ORAL | 0 refills | Status: DC

## 2020-08-29 NOTE — Telephone Encounter (Signed)
Patient called requesting a refill on her birth control and Valtrex.  One refill of each sent to pharmacy.  Patient is due for her annual. Annual appointment scheduled.

## 2020-09-14 ENCOUNTER — Ambulatory Visit (INDEPENDENT_AMBULATORY_CARE_PROVIDER_SITE_OTHER): Payer: BC Managed Care – PPO | Admitting: Obstetrics

## 2020-09-14 ENCOUNTER — Other Ambulatory Visit (HOSPITAL_COMMUNITY)
Admission: RE | Admit: 2020-09-14 | Discharge: 2020-09-14 | Disposition: A | Discharge status: Home / Self Care | Payer: BC Managed Care – PPO | Source: Ambulatory Visit | Attending: Obstetrics | Admitting: Obstetrics

## 2020-09-14 ENCOUNTER — Other Ambulatory Visit: Payer: Self-pay

## 2020-09-14 ENCOUNTER — Encounter: Payer: Self-pay | Admitting: Obstetrics

## 2020-09-14 VITALS — BP 140/86 | HR 80 | Ht 64.0 in | Wt 243.0 lb

## 2020-09-14 DIAGNOSIS — Z01419 Encounter for gynecological examination (general) (routine) without abnormal findings: Secondary | ICD-10-CM | POA: Insufficient documentation

## 2020-09-14 DIAGNOSIS — N898 Other specified noninflammatory disorders of vagina: Secondary | ICD-10-CM | POA: Insufficient documentation

## 2020-09-14 DIAGNOSIS — A6000 Herpesviral infection of urogenital system, unspecified: Secondary | ICD-10-CM

## 2020-09-14 DIAGNOSIS — E66813 Obesity, class 3: Secondary | ICD-10-CM

## 2020-09-14 DIAGNOSIS — Z6841 Body Mass Index (BMI) 40.0 and over, adult: Secondary | ICD-10-CM

## 2020-09-14 DIAGNOSIS — Z3041 Encounter for surveillance of contraceptive pills: Secondary | ICD-10-CM | POA: Diagnosis not present

## 2020-09-14 NOTE — Progress Notes (Signed)
Pt presents for annual and 1st pap smear.  Blood work STD testing declined  Pt reports intermittent "lump" in L breast and she notices the L breast is bigger than R breast now.

## 2020-09-14 NOTE — Progress Notes (Signed)
Subjective:        Emma Mendoza is a 20 y.o. female here for a routine exam.  Current complaints: Vaginal discharge.    Personal health questionnaire:  Is patient Ashkenazi Jewish, have a family history of breast and/or ovarian cancer: yes Is there a family history of uterine cancer diagnosed at age < 35, gastrointestinal cancer, urinary tract cancer, family member who is a Personnel officer syndrome-associated carrier: no Is the patient overweight and hypertensive, family history of diabetes, personal history of gestational diabetes, preeclampsia or PCOS: no Is patient over 67, have PCOS,  family history of premature CHD under age 71, diabetes, smoke, have hypertension or peripheral artery disease:  no At any time, has a partner hit, kicked or otherwise hurt or frightened you?: no Over the past 2 weeks, have you felt down, depressed or hopeless?: no Over the past 2 weeks, have you felt little interest or pleasure in doing things?:no   Gynecologic History No LMP recorded. (Menstrual status: Oral contraceptives). Contraception: OCP (estrogen/progesterone) Last Pap: n/a. Results were: n/a Last mammogram: n/a. Results were: n/a  Obstetric History OB History  Gravida Para Term Preterm AB Living  0 0 0 0 0 0  SAB IAB Ectopic Multiple Live Births  0 0 0 0 0    History reviewed. No pertinent past medical history.  History reviewed. No pertinent surgical history.   Current Outpatient Medications:  .  levonorgestrel-ethinyl estradiol (ALTAVERA) 0.15-30 MG-MCG tablet, Take 1 tablet by mouth daily., Disp: 84 tablet, Rfl: 0 .  valACYclovir (VALTREX) 1000 MG tablet, Take 1 tablet (1,000 mg total) by mouth daily., Disp: 5 tablet, Rfl: 1 .  clotrimazole-betamethasone (LOTRISONE) cream, Apply to affected area 2 times daily (Patient not taking: No sig reported), Disp: 30 g, Rfl: 0 .  metroNIDAZOLE (FLAGYL) 500 MG tablet, Take 1 tablet (500 mg total) by mouth 2 (two) times daily. (Patient not taking: No  sig reported), Disp: 14 tablet, Rfl: 0 .  nitrofurantoin, macrocrystal-monohydrate, (MACROBID) 100 MG capsule, Take 1 capsule (100 mg total) by mouth 2 (two) times daily. (Patient not taking: No sig reported), Disp: 10 capsule, Rfl: 0 No Known Allergies  Social History   Tobacco Use  . Smoking status: Never Smoker  . Smokeless tobacco: Never Used  Substance Use Topics  . Alcohol use: No    Family History  Problem Relation Age of Onset  . Cancer Paternal Aunt   . Diabetes Maternal Grandfather   . Diabetes Paternal Grandfather   . Cancer Paternal Grandfather   . Cancer Mother 87       breast cancer  . Diabetes Father       Review of Systems  Constitutional: negative for fatigue and weight loss Respiratory: negative for cough and wheezing Cardiovascular: negative for chest pain, fatigue and palpitations Gastrointestinal: negative for abdominal pain and change in bowel habits Musculoskeletal:negative for myalgias Neurological: negative for gait problems and tremors Behavioral/Psych: negative for abusive relationship, depression Endocrine: negative for temperature intolerance    Genitourinary: positive for vaginal discharge.  negative for abnormal menstrual periods, genital lesions, hot flashes, sexual problems  Integument/breast: negative for breast lump, breast tenderness, nipple discharge and skin lesion(s)    Objective:       BP 140/86   Pulse 80   Ht 5\' 4"  (1.626 m)   Wt 243 lb (110.2 kg)   BMI 41.71 kg/m  General:   alert and no distress  Skin:   no rash or abnormalities  Lungs:  clear to auscultation bilaterally  Heart:   regular rate and rhythm, S1, S2 normal, no murmur, click, rub or gallop  Breasts:   normal without suspicious masses, skin or nipple changes or axillary nodes  Abdomen:  normal findings: no organomegaly, soft, non-tender and no hernia  Pelvis:  External genitalia: normal general appearance Urinary system: urethral meatus normal and bladder  without fullness, nontender Vaginal: normal without tenderness, induration or masses Cervix: normal appearance Adnexa: normal bimanual exam Uterus: anteverted and non-tender, normal size   Lab Review Urine pregnancy test Labs reviewed yes Radiologic studies reviewed yes  I have spent a total of 20 minutes of face-to-face time, excluding clinical staff time, reviewing notes and preparing to see patient, ordering tests and/or medications, and counseling the patient.    Assessment:     1. Encounter for gynecological examination with Papanicolaou smear of cervix Rx: - Cytology - PAP( [facility])  2. Vaginal discharge Rx: - Cervicovaginal ancillary only  3. 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 Encounter for surveillance of contraceptive pills - pleased with OCP's  5. Class 3 severe obesity due to excess calories without serious comorbidity with body mass index (BMI) of 40.0 to 44.9 in adult (HCC) - weight loss with the aid of caloric reduction, exercise and behavioral modification recommended    Plan:    Education reviewed: calcium supplements, depression evaluation, low fat, low cholesterol diet, safe sex/STD prevention, self breast exams and weight bearing exercise. Contraception: OCP (estrogen/progesterone). Follow up in: 1 year.    Brock Bad, MD 09/14/2020 10:16 AM

## 2020-09-15 LAB — CERVICOVAGINAL ANCILLARY ONLY
Bacterial Vaginitis (gardnerella): POSITIVE — AB
Candida Glabrata: NEGATIVE
Candida Vaginitis: NEGATIVE
Chlamydia: NEGATIVE
Comment: NEGATIVE
Comment: NEGATIVE
Comment: NEGATIVE
Comment: NEGATIVE
Comment: NEGATIVE
Comment: NORMAL
Neisseria Gonorrhea: NEGATIVE
Trichomonas: NEGATIVE

## 2020-09-16 ENCOUNTER — Other Ambulatory Visit: Payer: Self-pay | Admitting: Obstetrics

## 2020-09-16 DIAGNOSIS — B9689 Other specified bacterial agents as the cause of diseases classified elsewhere: Secondary | ICD-10-CM

## 2020-09-16 DIAGNOSIS — N760 Acute vaginitis: Secondary | ICD-10-CM

## 2020-09-16 MED ORDER — METRONIDAZOLE 500 MG PO TABS: 500.0000 mg | ORAL_TABLET | Freq: Two times a day (BID) | ORAL | 0 refills | Status: DC

## 2020-09-18 LAB — CYTOLOGY - PAP: Diagnosis: NEGATIVE

## 2021-08-29 IMAGING — US US BREAST*L* LIMITED INC AXILLA
1 series · 7 of 7 positions shown · non-contrast
Comparison: None.

CLINICAL DATA: Patient presents for evaluation of suspected abscess
involving the inferior left breast. Patient has recently completed
course of antibiotic therapy. Patient stated that the area of
concern has decreased in size and recently ruptured and drained.

EXAM:
ULTRASOUND OF THE LEFT BREAST

[Series 1: us breast*left* limited inc axilla · 0.07mm/px · 7 of 7 slices shown]
[im 1/7]
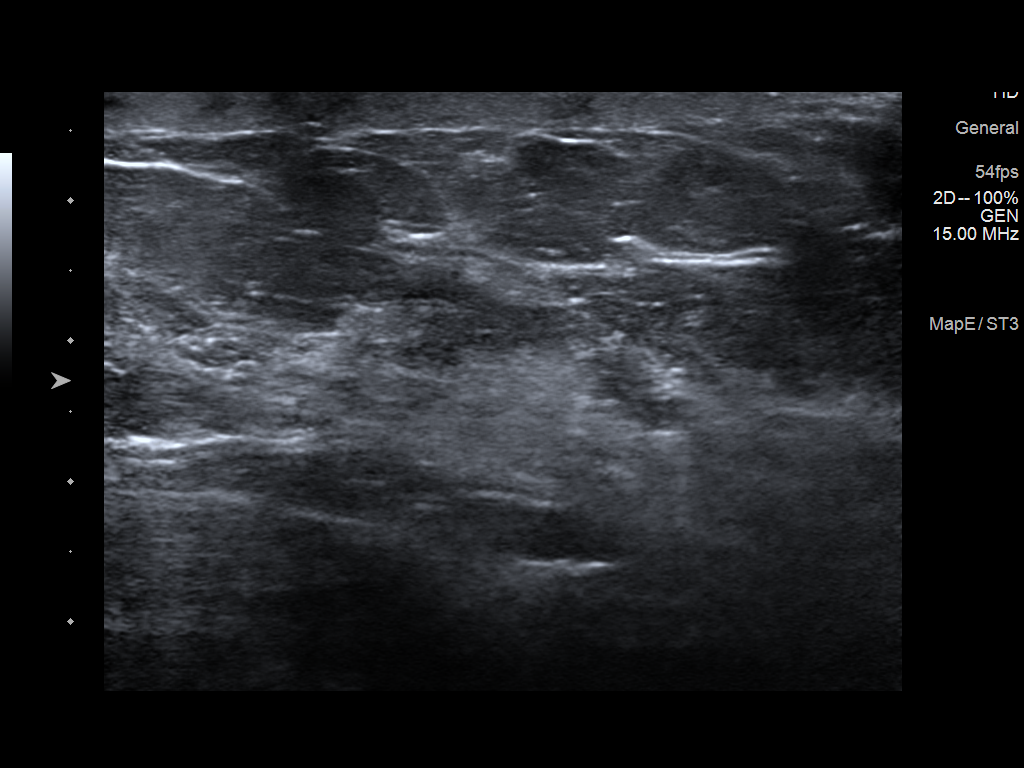
[im 2/7]
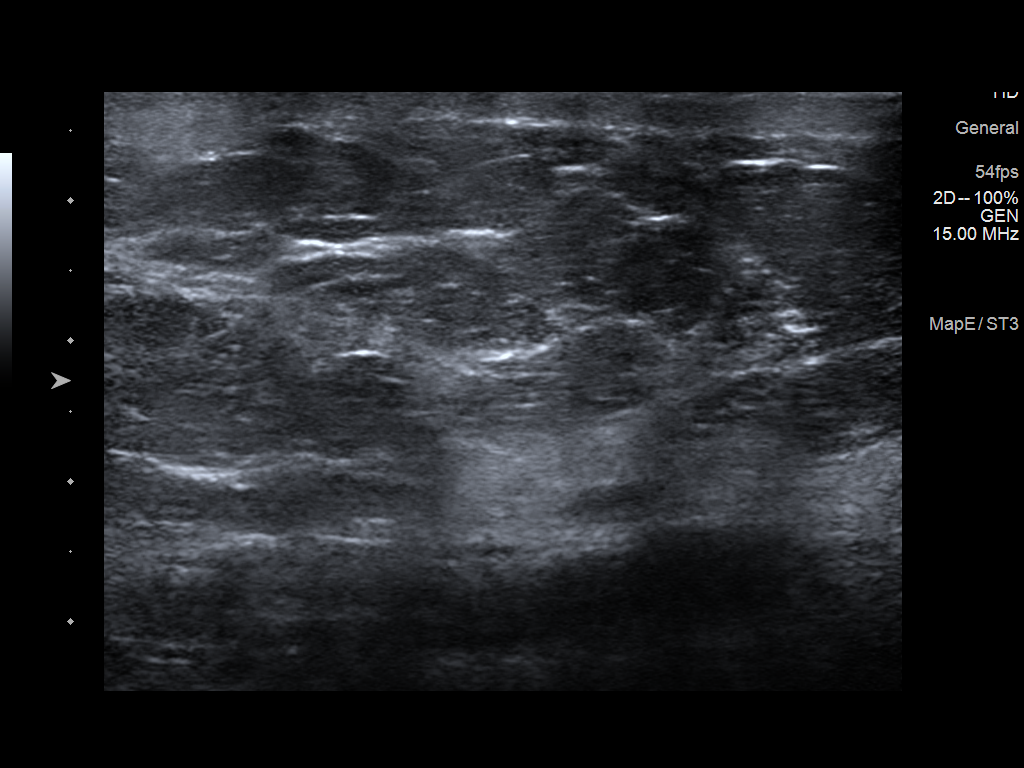
[im 3/7]
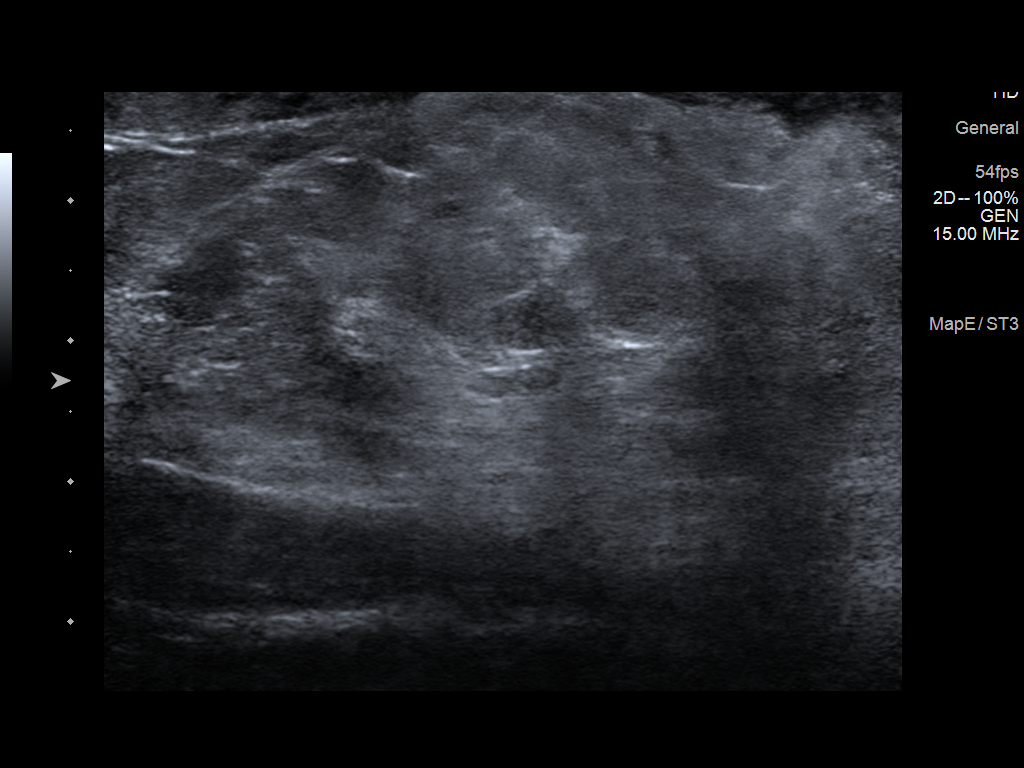
[im 4/7]
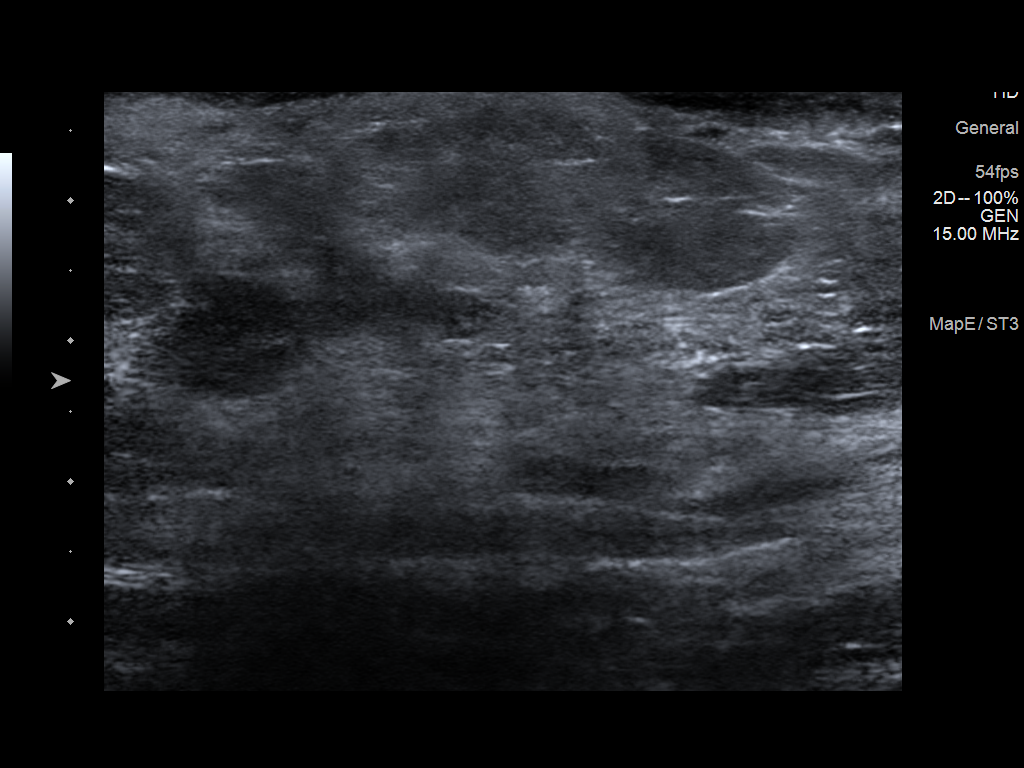
[im 5/7]
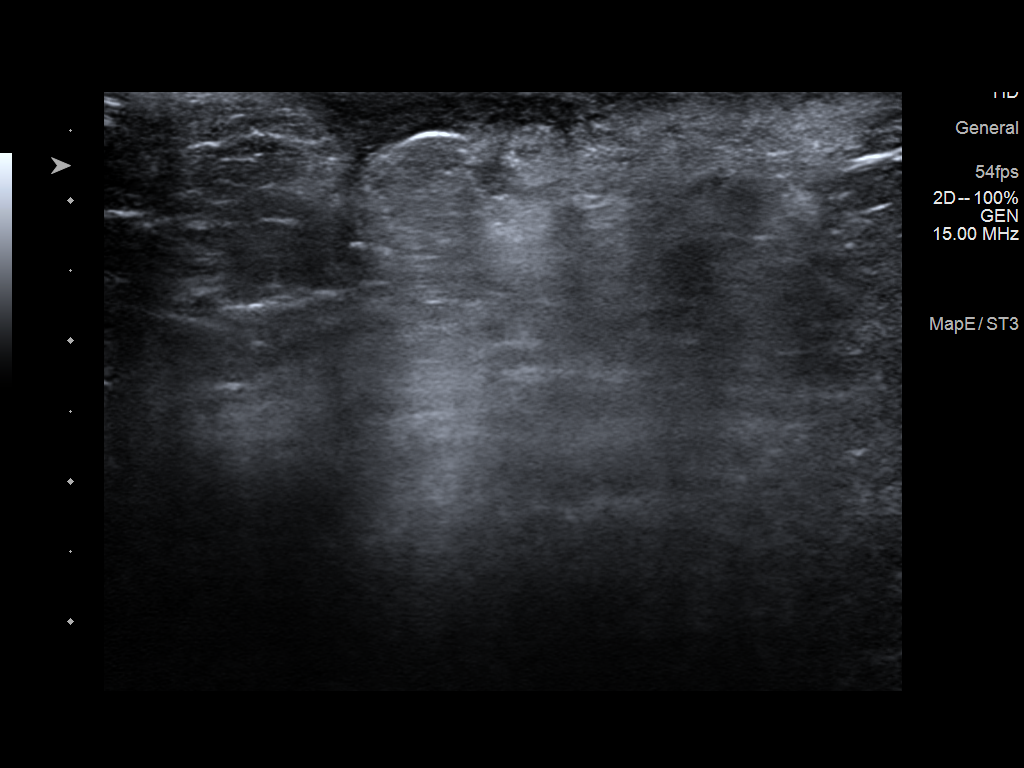
[im 6/7]
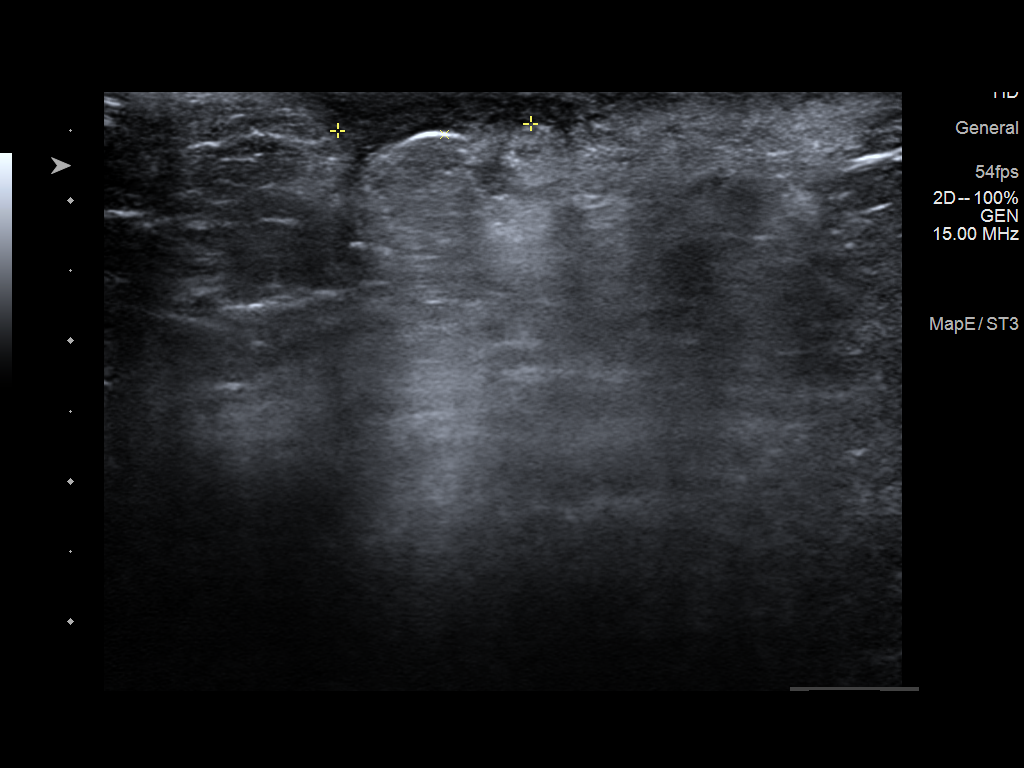
[im 7/7]
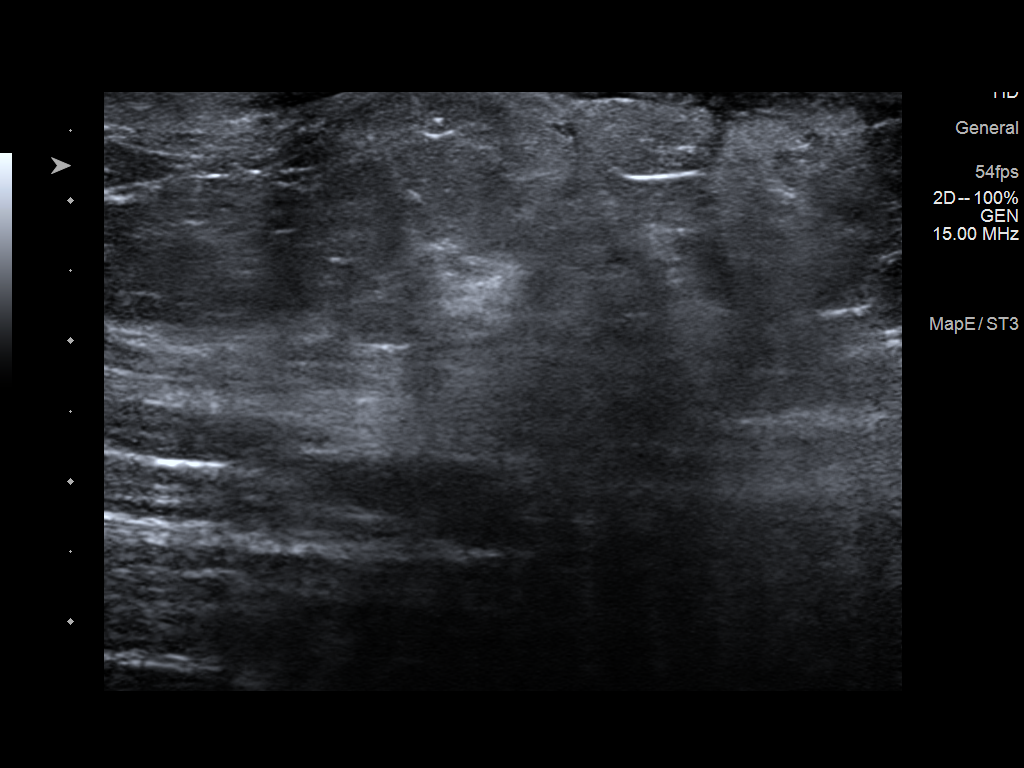

[7 of 7 positions shown; findings below may reference images not displayed]

FINDINGS: On physical exam, there is a small area of cutaneous redness
involving the 6 o'clock position left breast posterior depth. There
is a surrounding area of skin which is peeling from prior
cellulitis.

Targeted ultrasound is performed, showing skin thickening within the
left breast 5-6 o'clock position. There is a small amorphous amount
of fluid/phlegmonous change within the left breast 6 o'clock
position 5 cm from nipple measuring 1.4 x 0.4 cm. No drainable
abscess.
IMPRESSION: Findings most compatible with cellulitis and ruptured abscess. Small
amount of residual phlegmonous change within the 6 o'clock position
left breast.

RECOMMENDATION:
Recommend a second 10 day course of antibiotic therapy.

Recommend repeat physical exam and ultrasound in 10 days to reassess
for improvement/resolution.

I have discussed the findings and recommendations with the patient.
If applicable, a reminder letter will be sent to the patient
regarding the next appointment.

BI-RADS CATEGORY  3: Probably benign.

## 2021-09-10 IMAGING — US US BREAST*L* LIMITED INC AXILLA
1 series · 12 of 12 positions shown · non-contrast
Comparison: Previous exam(s).

CLINICAL DATA: Follow-up abscess. The patient has 2 days of
antibiotics remaining.

EXAM:
ULTRASOUND OF THE LEFT BREAST

[Series 1: us breast*left* limited inc axilla · 0.06mm/px · 12 of 12 slices shown]
[im 1/12]
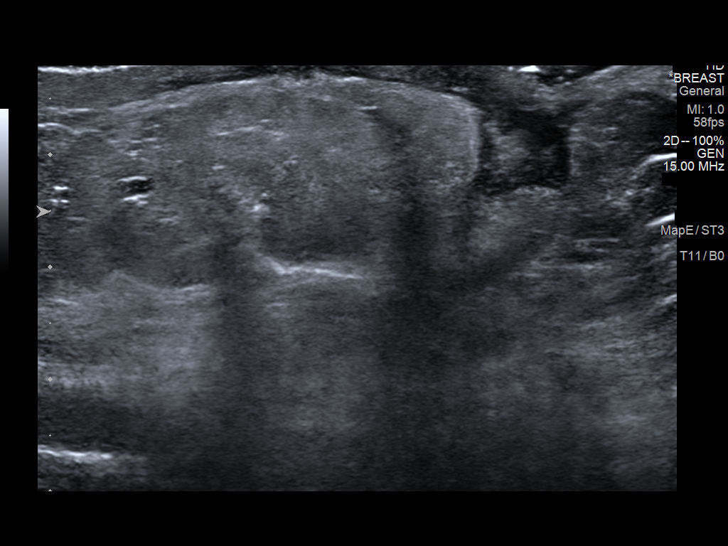
[im 2/12]
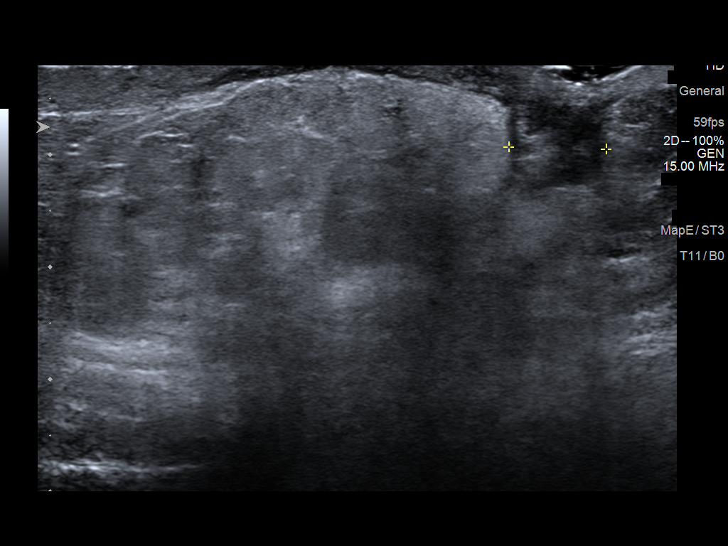
[im 3/12]
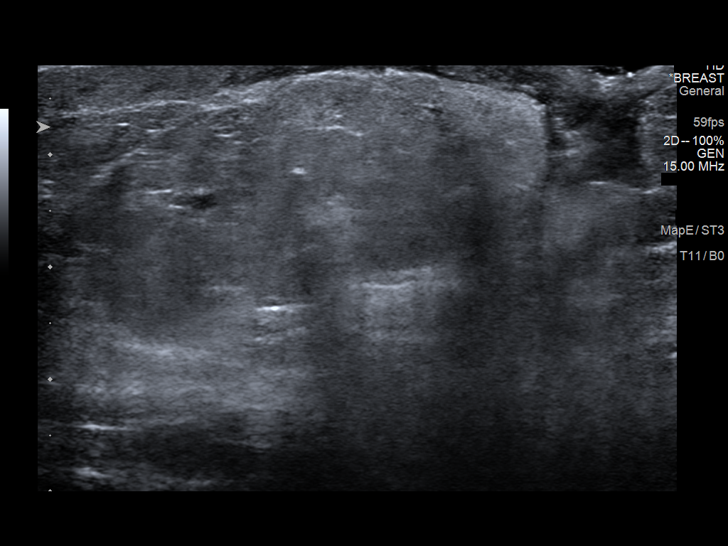
[im 4/12]
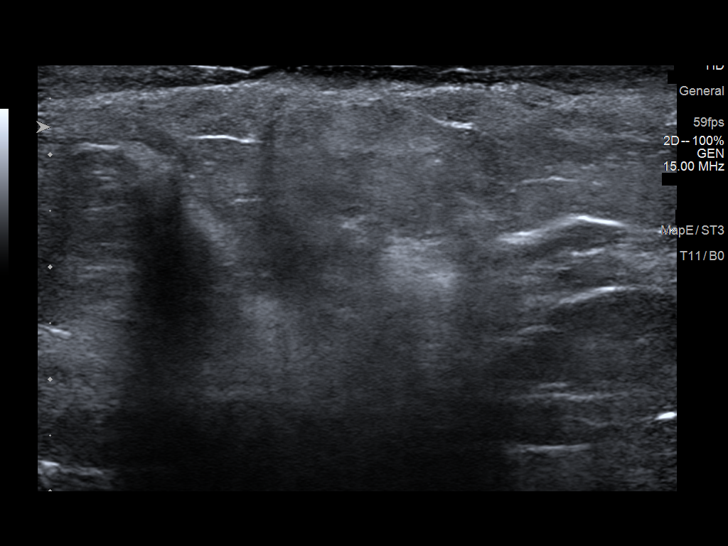
[im 5/12]
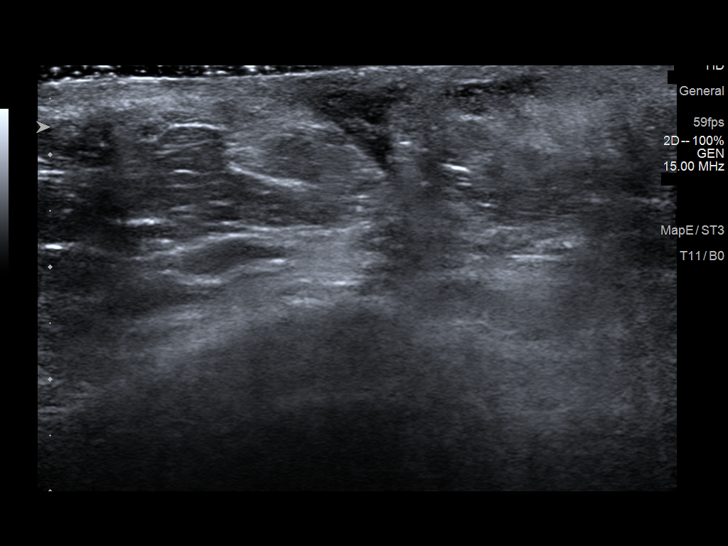
[im 6/12]
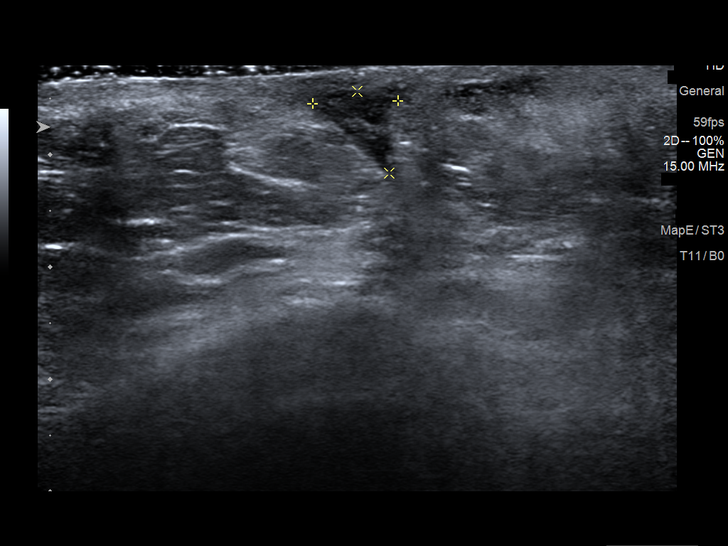
[im 7/12]
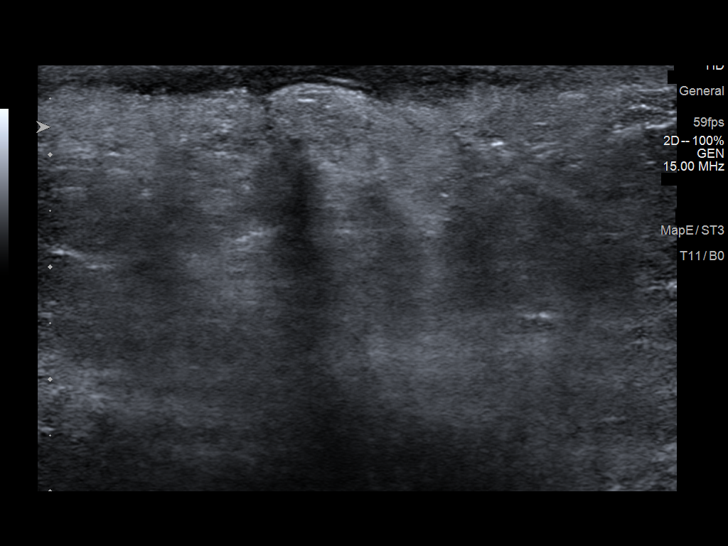
[im 8/12]
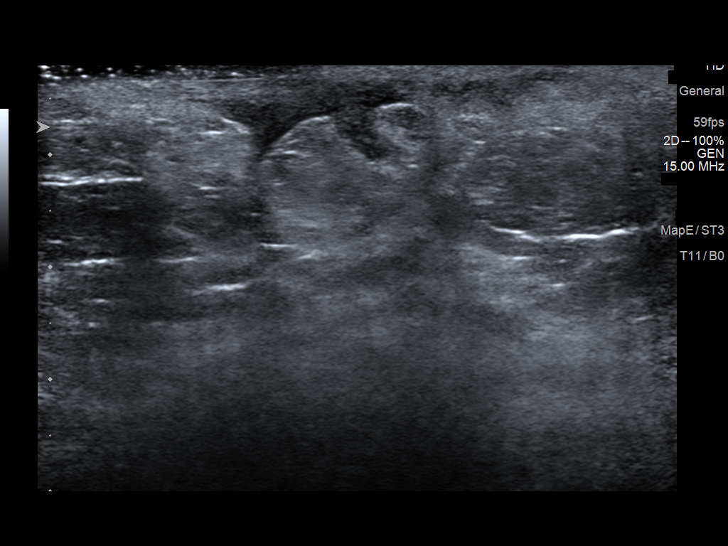
[im 9/12]
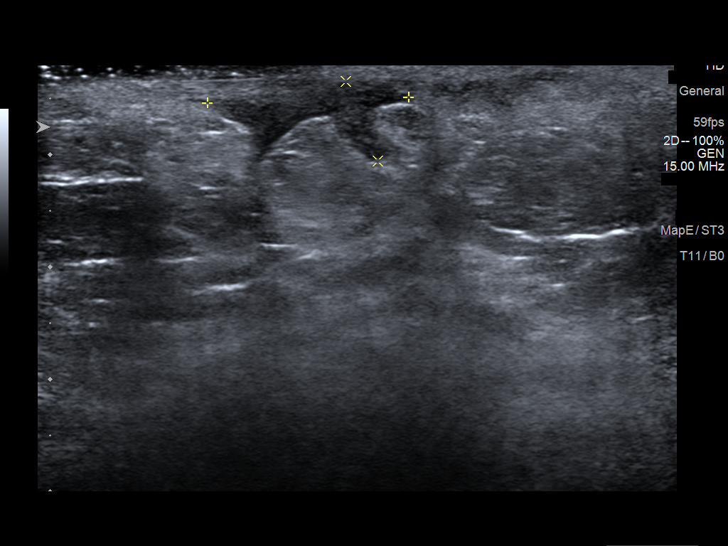
[im 10/12]
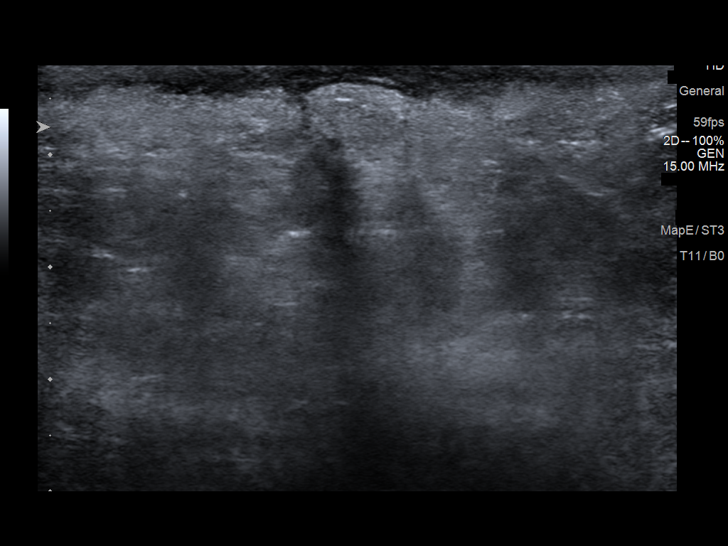
[im 11/12]
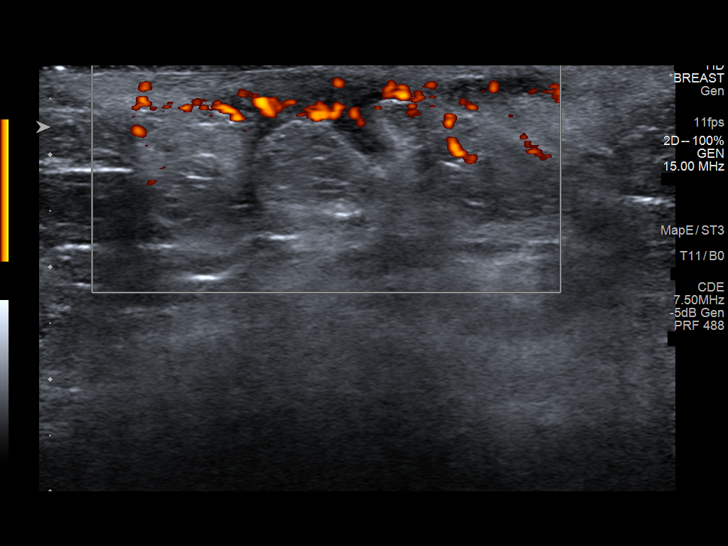
[im 12/12]
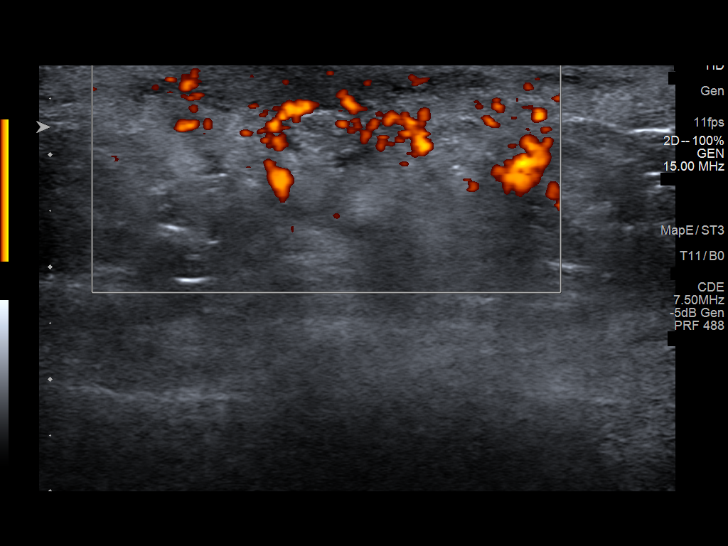

[12 of 12 positions shown; findings below may reference images not displayed]

FINDINGS: On physical exam, there has been significant interval improvement.
No erythema remains. The open wound has gotten smaller. The
patient's pain has decreased.

Targeted ultrasound is performed, showing a hypoechoic region at 6
o'clock, 5 cm from the nipple inferior to the open wound measuring 9
by 8 x 8 mm. The configuration is change but overall appear smaller.
IMPRESSION: The patient is clearly improving clinically. The imaging findings
persists but of also gotten somewhat smaller.

RECOMMENDATION:
Recommend a follow-up in 1 month to ensure complete resolution of
imaging findings.

I have discussed the findings and recommendations with the patient.
If applicable, a reminder letter will be sent to the patient
regarding the next appointment.

BI-RADS CATEGORY  3: Probably benign.

## 2022-03-13 ENCOUNTER — Encounter: Payer: Self-pay | Admitting: Family Medicine

## 2022-03-13 ENCOUNTER — Ambulatory Visit (INDEPENDENT_AMBULATORY_CARE_PROVIDER_SITE_OTHER): Payer: BC Managed Care – PPO | Admitting: Family Medicine

## 2022-03-13 VITALS — BP 126/88 | HR 88 | Ht 64.0 in | Wt 228.7 lb

## 2022-03-13 DIAGNOSIS — N61 Mastitis without abscess: Secondary | ICD-10-CM

## 2022-03-13 MED ORDER — CEFADROXIL 500 MG PO CAPS: 500.0000 mg | ORAL_CAPSULE | Freq: Two times a day (BID) | ORAL | 0 refills | Status: AC

## 2022-03-13 MED ORDER — FLUCONAZOLE 150 MG PO TABS: 150.0000 mg | ORAL_TABLET | Freq: Once | ORAL | 1 refills | Status: AC

## 2022-03-13 NOTE — Progress Notes (Signed)
   Subjective:    Patient ID: Emma Mendoza, female    DOB: [birth year], 21 y.o.   MRN: [id number]  HPI Patient seen for breast lump in the left breast that she noticed about a week ago. Sore to the touch and has increased in size. Thought it was her bra that was causing it, but still there after not wearing her bra. Gets these breast lumps in the same spot a couple of times a year - usually resolves on it's own. Did have an abscess there that spontaneously drained in 2021.   Review of Systems     Objective:   Physical Exam Vitals reviewed. Exam conducted with a chaperone present.  Constitutional:      Appearance: Normal appearance.  Chest:  Breasts:    Right: No swelling, inverted nipple, mass, nipple discharge or skin change.     Left: No swelling or inverted nipple.    Lymphadenopathy:     Upper Body:     Right upper body: No supraclavicular, axillary or pectoral adenopathy.     Left upper body: No supraclavicular, axillary or pectoral adenopathy.  Neurological:     Mental Status: She is alert.  Psychiatric:        Mood and Affect: Mood normal.        Behavior: Behavior normal.        Thought Content: Thought content normal.        Judgment: Judgment normal.        Assessment & Plan:   1. Cellulitis of left breast Start cefadroxil.  Recommended warm compresses, ibuprofen.  Recommended no underwire bras at this point. F/u in 2 weeks for recheck if not resolved.

## 2022-03-13 NOTE — Progress Notes (Signed)
Pt is in the office complaining of lump under L breast, painful when wearing a bra. Pt states that she had this issue 1-2 years ago and was prescribed antibiotics which helped. Last pap 09/14/2020 LMP 02/22/22

## 2022-03-27 ENCOUNTER — Ambulatory Visit (INDEPENDENT_AMBULATORY_CARE_PROVIDER_SITE_OTHER): Payer: BC Managed Care – PPO | Admitting: Obstetrics

## 2022-03-27 ENCOUNTER — Encounter: Payer: Self-pay | Admitting: Obstetrics

## 2022-03-27 VITALS — BP 125/84 | HR 75 | Ht 64.0 in | Wt 228.0 lb

## 2022-03-27 DIAGNOSIS — N611 Abscess of the breast and nipple: Secondary | ICD-10-CM | POA: Diagnosis not present

## 2022-03-27 NOTE — Progress Notes (Signed)
Patient ID: Emma Mendoza, female   DOB: October 17, 2000, 21 y.o.   MRN: [id number]  Chief Complaint  Patient presents with   Gynecologic Exam    HPI Emma Mendoza is a 21 y.o. female.  Presents for f/u from I&D of left breast abscess on 12/8/ 23.  No complaints.  She states that there is scant drainage from incision.  HPI  History reviewed. No pertinent past medical history.  History reviewed. No pertinent surgical history.  Family History  Problem Relation Age of Onset   Cancer Paternal Aunt    Diabetes Maternal Grandfather    Diabetes Paternal Grandfather    Cancer Paternal Grandfather    Cancer Mother 22       breast cancer   Diabetes Father     Social History Social History   Tobacco Use   Smoking status: Never   Smokeless tobacco: Never  Vaping Use   Vaping Use: Never used  Substance Use Topics   Alcohol use: Yes    Comment: socially   Drug use: No    No Known Allergies  Current Outpatient Medications  Medication Sig Dispense Refill   valACYclovir (VALTREX) 1000 MG tablet Take 1 tablet (1,000 mg total) by mouth daily. 5 tablet 1   clotrimazole-betamethasone (LOTRISONE) cream Apply to affected area 2 times daily 30 g 0   levonorgestrel-ethinyl estradiol (ALTAVERA) 0.15-30 MG-MCG tablet Take 1 tablet by mouth daily. 84 tablet 0   No current facility-administered medications for this visit.    Review of Systems Review of Systems Constitutional: negative for fatigue and weight loss Respiratory: negative for cough and wheezing Cardiovascular: negative for chest pain, fatigue and palpitations Gastrointestinal: negative for abdominal pain and change in bowel habits Genitourinary:negative Integument/breast: healing left breast abscess after drainage, non tender Musculoskeletal:negative for myalgias Neurological: negative for gait problems and tremors Behavioral/Psych: negative for abusive relationship, depression Endocrine: negative for temperature intolerance       Blood pressure 125/84, pulse 75, height 5\' 4"  (1.626 m), weight 228 lb (103.4 kg), last menstrual period 03/15/2022.  Physical Exam Physical Exam General:   Alert and no distress  Skin:   no rash or abnormalities  Lungs:   clear to auscultation bilaterally  Heart:   regular rate and rhythm, S1, S2 normal, no murmur, click, rub or gallop  Breasts:   Left Breast  Incision clean, dry with minimal drainage:       I have spent a total of 20 minutes of face-to-face time, excluding clinical staff time, reviewing notes and preparing to see patient, ordering tests and/or medications, and counseling the patient.    Data Reviewed labs  Assessment     1. Nonpuerperal abscess of left breast - status post I&D - doing well     Plan   Follow up as scheduled  Emma Mendoza A. 14/04/2021 MD 03/27/2022

## 2022-03-27 NOTE — Progress Notes (Signed)
Patient presents for follow up for lump in her left breast. Patient had an I&D completed on 12/8. Patient states that she is feeling a lot better now. Area is still draining a little bit. Denies odor, pain, or fever.

## 2022-04-17 ENCOUNTER — Encounter: Payer: Self-pay | Admitting: Obstetrics

## 2022-04-17 ENCOUNTER — Other Ambulatory Visit (HOSPITAL_COMMUNITY)
Admission: RE | Admit: 2022-04-17 | Discharge: 2022-04-17 | Disposition: A | Discharge status: Home / Self Care | Payer: BC Managed Care – PPO | Source: Ambulatory Visit | Attending: Obstetrics | Admitting: Obstetrics

## 2022-04-17 ENCOUNTER — Ambulatory Visit (INDEPENDENT_AMBULATORY_CARE_PROVIDER_SITE_OTHER): Payer: BC Managed Care – PPO | Admitting: Obstetrics

## 2022-04-17 VITALS — BP 117/75 | HR 69 | Wt 231.5 lb

## 2022-04-17 DIAGNOSIS — Z01419 Encounter for gynecological examination (general) (routine) without abnormal findings: Secondary | ICD-10-CM | POA: Diagnosis not present

## 2022-04-17 DIAGNOSIS — N898 Other specified noninflammatory disorders of vagina: Secondary | ICD-10-CM | POA: Diagnosis not present

## 2022-04-17 DIAGNOSIS — E669 Obesity, unspecified: Secondary | ICD-10-CM

## 2022-04-17 DIAGNOSIS — A6004 Herpesviral vulvovaginitis: Secondary | ICD-10-CM

## 2022-04-17 DIAGNOSIS — Z1501 Genetic susceptibility to malignant neoplasm of breast: Secondary | ICD-10-CM | POA: Diagnosis not present

## 2022-04-17 DIAGNOSIS — D508 Other iron deficiency anemias: Secondary | ICD-10-CM

## 2022-04-17 DIAGNOSIS — Z3041 Encounter for surveillance of contraceptive pills: Secondary | ICD-10-CM

## 2022-04-17 DIAGNOSIS — N6320 Unspecified lump in the left breast, unspecified quadrant: Secondary | ICD-10-CM

## 2022-04-17 MED ORDER — VALACYCLOVIR HCL 1 G PO TABS: 1000.0000 mg | ORAL_TABLET | Freq: Every day | ORAL | 5 refills | Status: AC

## 2022-04-17 MED ORDER — LEVONORGESTREL-ETHINYL ESTRAD 0.15-30 MG-MCG PO TABS: 1.0000 | ORAL_TABLET | Freq: Every day | ORAL | 0 refills | Status: DC

## 2022-04-17 NOTE — Progress Notes (Signed)
Pt presents for annual and pap. Declines STD testing today.  Pt reports possible anemia.  Pt reports L breast pea size lump.

## 2022-04-17 NOTE — Progress Notes (Signed)
Subjective:        Emma Mendoza is a 22 y.o. female here for a routine exam.  Current complaints: Vaginal discharge.    Personal health questionnaire:  Is patient Ashkenazi Jewish, have a family history of breast and/or ovarian cancer: yes Is there a family history of uterine cancer diagnosed at age < 99, gastrointestinal cancer, urinary tract cancer, family member who is a Field seismologist syndrome-associated carrier: no Is the patient overweight and hypertensive, family history of diabetes, personal history of gestational diabetes, preeclampsia or PCOS: no Is patient over 30, have PCOS,  family history of premature CHD under age 66, diabetes, smoke, have hypertension or peripheral artery disease:  no At any time, has a partner hit, kicked or otherwise hurt or frightened you?: no Over the past 2 weeks, have you felt down, depressed or hopeless?: no Over the past 2 weeks, have you felt little interest or pleasure in doing things?:no   Gynecologic History Patient's last menstrual period was 03/15/2022. Contraception: condoms and OCP (estrogen/progesterone) Last Pap: 2022. Results were: normal Last mammogram: n/a. Results were: n/a  Obstetric History OB History  Gravida Para Term Preterm AB Living  0 0 0 0 0 0  SAB IAB Ectopic Multiple Live Births  0 0 0 0 0    History reviewed. No pertinent past medical history.  History reviewed. No pertinent surgical history.   Current Outpatient Medications:    clotrimazole-betamethasone (LOTRISONE) cream, Apply to affected area 2 times daily (Patient not taking: Reported on 04/17/2022), Disp: 30 g, Rfl: 0   levonorgestrel-ethinyl estradiol (ALTAVERA) 0.15-30 MG-MCG tablet, Take 1 tablet by mouth daily., Disp: 84 tablet, Rfl: 0   valACYclovir (VALTREX) 1000 MG tablet, Take 1 tablet (1,000 mg total) by mouth daily. Take for 3 days with each outbreak., Disp: 30 tablet, Rfl: 5 No Known Allergies  Social History   Tobacco Use   Smoking status: Never     Passive exposure: Never   Smokeless tobacco: Never  Substance Use Topics   Alcohol use: Yes    Comment: socially    Family History  Problem Relation Age of Onset   Cancer Paternal Aunt    Diabetes Maternal Grandfather    Diabetes Paternal Grandfather    Cancer Paternal Grandfather    Cancer Mother 68       breast cancer   Diabetes Father       Review of Systems  Constitutional: negative for fatigue and weight loss Respiratory: negative for cough and wheezing Cardiovascular: negative for chest pain, fatigue and palpitations Gastrointestinal: negative for abdominal pain and change in bowel habits Musculoskeletal:negative for myalgias Neurological: negative for gait problems and tremors Behavioral/Psych: negative for abusive relationship, depression Endocrine: negative for temperature intolerance    Genitourinary: positive for vaginal discharge.  negative for abnormal menstrual periods, genital lesions, hot flashes, sexual problems and vaginal discharge Integument/breast: negative for breast lump, breast tenderness, nipple discharge and skin lesion(s)    Objective:       BP 117/75   Pulse 69   Wt 231 lb 8 oz (105 kg)   LMP 03/15/2022   BMI 39.74 kg/m  General:   Alert and no distress  Skin:   no rash or abnormalities  Lungs:   clear to auscultation bilaterally  Heart:   regular rate and rhythm, S1, S2 normal, no murmur, click, rub or gallop  Breasts:   normal without suspicious masses, skin or nipple changes or axillary nodes  Abdomen:  normal findings: no organomegaly,  soft, non-tender and no hernia  Pelvis:  External genitalia: normal general appearance Urinary system: urethral meatus normal and bladder without fullness, nontender Vaginal: normal without tenderness, induration or masses Cervix: normal appearance Adnexa: normal bimanual exam Uterus: anteverted and non-tender, normal size   Lab Review Urine pregnancy test Labs reviewed yes Radiologic studies  reviewed no  I have spent a total of 20 minutes of face-to-face time, excluding clinical staff time, reviewing notes and preparing to see patient, ordering tests and/or medications, and counseling the patient.   Assessment:    1. Encounter for gynecological examination with Papanicolaou smear of cervix Rx: - Cytology - PAP( [facility])  2. Vaginal discharge Rx: - Cervicovaginal ancillary only  3. Mass of left breast, unspecified quadrant Rx: - US BREAST ASPIRATION LEFT; Future  4. Breast cancer genetic susceptibility Rx: - [facility] (2+38)  5. Iron deficiency anemia secondary to inadequate dietary iron intake Rx: - CBC - Ferritin  6. Obesity (BMI 35.0-39.9 without comorbidity) - weight reduction recommended  7. Oral contraceptive pill surveillance Rx: - levonorgestrel-ethinyl estradiol (ALTAVERA) 0.15-30 MG-MCG tablet; Take 1 tablet by mouth daily.  Dispense: 84 tablet; Refill: 0  8. Herpes simplex vulvovaginitis Rx: - valACYclovir (VALTREX) 1000 MG tablet; Take 1 tablet (1,000 mg total) by mouth daily. Take for 3 days with each outbreak.  Dispense: 30 tablet; Refill: 5    Plan:    Education reviewed: calcium supplements, depression evaluation, low fat, low cholesterol diet, safe sex/STD prevention, self breast exams, and weight bearing exercise. Contraception: OCP (estrogen/progesterone). Follow up in: 1 year.   Meds ordered this encounter  Medications   levonorgestrel-ethinyl estradiol (ALTAVERA) 0.15-30 MG-MCG tablet    Sig: Take 1 tablet by mouth daily.    Dispense:  84 tablet    Refill:  0   valACYclovir (VALTREX) 1000 MG tablet    Sig: Take 1 tablet (1,000 mg total) by mouth daily. Take for 3 days with each outbreak.    Dispense:  30 tablet    Refill:  5   Orders Placed This Encounter  Procedures   US BREAST ASPIRATION LEFT    Standing Status:   Future    Standing Expiration Date:   04/18/2023    Order Specific Question:   Reason for Exam  (SYMPTOM  OR DIAGNOSIS REQUIRED)    Answer:   Breast mass.  Ptreviously aspirated.    Order Specific Question:   Preferred imaging location?    Answer:   [facility] (2+38)    Personal and family History of Cancer If there are multiple personal or family cancer history, please enter details below -  Personal History of Cancer:: No  Cancer Type: none Age of Diagnosis:  Cancer Type: Age of Diagnosis:   Family History of Cancer:  Cancer type: Breast Relation: Mother Paternal/Maternal: maternal Age of Diagnosis:    Cancer type: Breast Relation: Aunt Paternal/Maternal: maternal Age of Diagnosis:    ==========Department Information========== ID: [id number] Department:[facility] [place], [place] [address] Dept: [phone] Dept Fax: [phone]    Order Specific Question:   Johnsie Cancel to follow up with patient for sample collection (mobile phleb, lab, or saliva):    Answer:   No    Order Specific Question:   Ethnicity of patient:    Answer:   African American    Order  Specific Question:   Patient and physician allow Johnsie Cancel to share order details with 3rd party genetic counselor?    Answer:   Yes    Order Specific Question:   Does this patient have a personal history of cancer?    Answer:   No    Order Specific Question:   Does this patient have a known family history of cancer?    Answer:   Yes-complete paperwork and place in kit    Order Specific Question:   By placing this electronic order I confirm the testing ordered herein is medically necessary and this patient has been informed of the details of the genetic test(s) ordered, including the risks, benefits, and alternatives, and has consented to testing.    Answer:   Yes    Order Specific Question:   Select an order diagnosis: For additional options refer to  [email or web address]    Answer:   Family history of malignant neoplasm of breast [V16.3.ICD-9-CM]    Order Specific Question:   What type of billing?    Answer:   [employer]   CBC   Ferritin    Shelly Bombard, MD 04/17/2022 2:57 PM

## 2022-04-18 LAB — CBC
Hematocrit: 38.9 % (ref 34.0–46.6)
Hemoglobin: 12.9 g/dL (ref 11.1–15.9)
MCH: 29.5 pg (ref 26.6–33.0)
MCHC: 33.2 g/dL (ref 31.5–35.7)
MCV: 89 fL (ref 79–97)
Platelets: 280 10*3/uL (ref 150–450)
RBC: 4.37 x10E6/uL (ref 3.77–5.28)
RDW: 13 % (ref 11.7–15.4)
WBC: 2.6 10*3/uL — ABNORMAL LOW (ref 3.4–10.8)

## 2022-04-18 LAB — CERVICOVAGINAL ANCILLARY ONLY
Bacterial Vaginitis (gardnerella): POSITIVE — AB
Candida Glabrata: NEGATIVE
Candida Vaginitis: NEGATIVE
Chlamydia: NEGATIVE
Comment: NEGATIVE
Comment: NEGATIVE
Comment: NEGATIVE
Comment: NEGATIVE
Comment: NEGATIVE
Comment: NORMAL
Neisseria Gonorrhea: NEGATIVE
Trichomonas: POSITIVE — AB

## 2022-04-18 LAB — FERRITIN: Ferritin: 144 ng/mL (ref 15–150)

## 2022-04-19 ENCOUNTER — Telehealth: Payer: Self-pay | Admitting: Emergency Medicine

## 2022-04-19 ENCOUNTER — Other Ambulatory Visit: Payer: Self-pay | Admitting: Obstetrics

## 2022-04-19 DIAGNOSIS — N760 Acute vaginitis: Secondary | ICD-10-CM

## 2022-04-19 MED ORDER — METRONIDAZOLE 500 MG PO TABS: 500.0000 mg | ORAL_TABLET | Freq: Two times a day (BID) | ORAL | 2 refills | Status: AC

## 2022-04-19 NOTE — Telephone Encounter (Signed)
-----   Message from Charles A Harper, MD sent at 04/19/2022  8:24 AM EST ----- Flagyl Rx for BV and Trichomonas. 

## 2022-04-19 NOTE — Telephone Encounter (Signed)
Attempted TC to patient, LVM to return call to office.

## 2022-04-22 ENCOUNTER — Ambulatory Visit
Admission: RE | Admit: 2022-04-22 | Discharge: 2022-04-22 | Disposition: A | Discharge status: Home / Self Care | Payer: BC Managed Care – PPO | Source: Ambulatory Visit | Attending: Obstetrics | Admitting: Obstetrics

## 2022-04-22 ENCOUNTER — Telehealth: Payer: Self-pay | Admitting: Emergency Medicine

## 2022-04-22 DIAGNOSIS — N6320 Unspecified lump in the left breast, unspecified quadrant: Secondary | ICD-10-CM

## 2022-04-22 LAB — CYTOLOGY - PAP: Diagnosis: NEGATIVE

## 2022-04-22 NOTE — Telephone Encounter (Signed)
Pt informed of results, Rx.   

## 2022-04-22 NOTE — Telephone Encounter (Signed)
-----   Message from Shelly Bombard, MD sent at 04/19/2022  8:24 AM EST ----- Flagyl Rx for BV and Trichomonas.

## 2022-05-02 ENCOUNTER — Encounter: Payer: Self-pay | Admitting: Obstetrics

## 2022-07-07 ENCOUNTER — Other Ambulatory Visit: Payer: Self-pay | Admitting: Obstetrics

## 2022-07-07 DIAGNOSIS — Z3041 Encounter for surveillance of contraceptive pills: Secondary | ICD-10-CM
